# Patient Record
Sex: Male | Born: 1950 | Race: White | Hispanic: No | Marital: Married | Smoking: Never smoker
Health system: Southern US, Community
[De-identification: ages and names within clinical notes are randomized; demographics above are authoritative.]

## PROBLEM LIST (undated history)

## (undated) DIAGNOSIS — N189 Chronic kidney disease, unspecified: Secondary | ICD-10-CM

## (undated) DIAGNOSIS — G4730 Sleep apnea, unspecified: Secondary | ICD-10-CM

## (undated) DIAGNOSIS — N429 Disorder of prostate, unspecified: Secondary | ICD-10-CM

## (undated) DIAGNOSIS — K851 Biliary acute pancreatitis without necrosis or infection: Secondary | ICD-10-CM

## (undated) DIAGNOSIS — M1990 Unspecified osteoarthritis, unspecified site: Secondary | ICD-10-CM

## (undated) DIAGNOSIS — H9190 Unspecified hearing loss, unspecified ear: Secondary | ICD-10-CM

## (undated) DIAGNOSIS — I10 Essential (primary) hypertension: Secondary | ICD-10-CM

## (undated) DIAGNOSIS — I499 Cardiac arrhythmia, unspecified: Secondary | ICD-10-CM

## (undated) DIAGNOSIS — K219 Gastro-esophageal reflux disease without esophagitis: Secondary | ICD-10-CM

## (undated) DIAGNOSIS — E039 Hypothyroidism, unspecified: Secondary | ICD-10-CM

## (undated) HISTORY — PX: COLONOSCOPY: SHX174

## (undated) HISTORY — PX: KIDNEY STONE SURGERY: SHX686

## (undated) HISTORY — PX: INCISION AND DRAINAGE: SHX5863

## (undated) HISTORY — PX: ARM SKIN LESION BIOPSY / EXCISION: SUR471

## (undated) HISTORY — DX: Biliary acute pancreatitis without necrosis or infection: K85.10

---

## 2006-02-16 DIAGNOSIS — I10 Essential (primary) hypertension: Secondary | ICD-10-CM | POA: Insufficient documentation

## 2008-01-02 ENCOUNTER — Ambulatory Visit: Payer: Self-pay | Admitting: Gastroenterology

## 2008-01-02 LAB — HM COLONOSCOPY: HM Colonoscopy: NORMAL

## 2009-04-16 DIAGNOSIS — F988 Other specified behavioral and emotional disorders with onset usually occurring in childhood and adolescence: Secondary | ICD-10-CM | POA: Insufficient documentation

## 2010-01-21 DIAGNOSIS — Z8349 Family history of other endocrine, nutritional and metabolic diseases: Secondary | ICD-10-CM | POA: Insufficient documentation

## 2011-01-06 ENCOUNTER — Ambulatory Visit: Payer: Self-pay | Admitting: Urology

## 2011-09-20 ENCOUNTER — Ambulatory Visit: Payer: Self-pay | Admitting: Urology

## 2012-04-04 ENCOUNTER — Ambulatory Visit: Payer: Self-pay | Admitting: Family Medicine

## 2012-07-17 DIAGNOSIS — R972 Elevated prostate specific antigen [PSA]: Secondary | ICD-10-CM | POA: Insufficient documentation

## 2012-07-17 DIAGNOSIS — N138 Other obstructive and reflux uropathy: Secondary | ICD-10-CM | POA: Insufficient documentation

## 2012-07-17 DIAGNOSIS — N411 Chronic prostatitis: Secondary | ICD-10-CM | POA: Insufficient documentation

## 2012-07-17 DIAGNOSIS — R6882 Decreased libido: Secondary | ICD-10-CM | POA: Insufficient documentation

## 2012-07-17 DIAGNOSIS — N529 Male erectile dysfunction, unspecified: Secondary | ICD-10-CM | POA: Insufficient documentation

## 2012-07-17 DIAGNOSIS — M5430 Sciatica, unspecified side: Secondary | ICD-10-CM | POA: Insufficient documentation

## 2013-04-02 ENCOUNTER — Ambulatory Visit: Payer: Self-pay | Admitting: Urology

## 2014-04-27 ENCOUNTER — Ambulatory Visit: Payer: Self-pay | Admitting: Family Medicine

## 2014-04-28 DIAGNOSIS — N23 Unspecified renal colic: Secondary | ICD-10-CM | POA: Insufficient documentation

## 2014-04-29 ENCOUNTER — Ambulatory Visit: Payer: Self-pay | Admitting: Urology

## 2014-04-29 HISTORY — PX: OTHER SURGICAL HISTORY: SHX169

## 2014-04-29 LAB — CBC WITH DIFFERENTIAL/PLATELET
Basophil #: 0 10*3/uL (ref 0.0–0.1)
Basophil %: 0.2 %
Eosinophil #: 0.1 10*3/uL (ref 0.0–0.7)
Eosinophil %: 1.2 %
HCT: 43.2 % (ref 40.0–52.0)
HGB: 14.2 g/dL (ref 13.0–18.0)
LYMPHS ABS: 0.3 10*3/uL — AB (ref 1.0–3.6)
Lymphocyte %: 3.7 %
MCH: 30.2 pg (ref 26.0–34.0)
MCHC: 32.9 g/dL (ref 32.0–36.0)
MCV: 92 fL (ref 80–100)
MONOS PCT: 8.1 %
Monocyte #: 0.7 x10 3/mm (ref 0.2–1.0)
NEUTROS ABS: 7.3 10*3/uL — AB (ref 1.4–6.5)
NEUTROS PCT: 86.8 %
Platelet: 162 10*3/uL (ref 150–440)
RBC: 4.71 10*6/uL (ref 4.40–5.90)
RDW: 14.2 % (ref 11.5–14.5)
WBC: 8.4 10*3/uL (ref 3.8–10.6)

## 2014-04-29 LAB — COMPREHENSIVE METABOLIC PANEL
ALBUMIN: 3.4 g/dL (ref 3.4–5.0)
Alkaline Phosphatase: 142 U/L — ABNORMAL HIGH
Anion Gap: 7 (ref 7–16)
BUN: 19 mg/dL — AB (ref 7–18)
Bilirubin,Total: 2.1 mg/dL — ABNORMAL HIGH (ref 0.2–1.0)
CHLORIDE: 108 mmol/L — AB (ref 98–107)
CO2: 24 mmol/L (ref 21–32)
CREATININE: 1.91 mg/dL — AB (ref 0.60–1.30)
Calcium, Total: 8.8 mg/dL (ref 8.5–10.1)
EGFR (African American): 43 — ABNORMAL LOW
GFR CALC NON AF AMER: 37 — AB
Glucose: 113 mg/dL — ABNORMAL HIGH (ref 65–99)
Osmolality: 281 (ref 275–301)
Potassium: 4.2 mmol/L (ref 3.5–5.1)
SGOT(AST): 139 U/L — ABNORMAL HIGH (ref 15–37)
SGPT (ALT): 222 U/L — ABNORMAL HIGH
SODIUM: 139 mmol/L (ref 136–145)
Total Protein: 6.9 g/dL (ref 6.4–8.2)

## 2014-04-29 LAB — URINALYSIS, COMPLETE
Bacteria: NONE SEEN
Bilirubin,UR: NEGATIVE
GLUCOSE, UR: NEGATIVE mg/dL (ref 0–75)
Ketone: NEGATIVE
LEUKOCYTE ESTERASE: NEGATIVE
Nitrite: NEGATIVE
Ph: 6 (ref 4.5–8.0)
Protein: NEGATIVE
RBC,UR: 4 /HPF (ref 0–5)
SQUAMOUS EPITHELIAL: NONE SEEN
Specific Gravity: 1.008 (ref 1.003–1.030)
WBC UR: <1 /HPF (ref 0–5)

## 2014-05-01 LAB — URINE CULTURE

## 2014-05-04 LAB — CULTURE, BLOOD (SINGLE)

## 2014-05-05 ENCOUNTER — Ambulatory Visit: Payer: Self-pay | Admitting: Urology

## 2014-05-11 ENCOUNTER — Ambulatory Visit: Payer: Self-pay | Admitting: Urology

## 2014-05-16 ENCOUNTER — Inpatient Hospital Stay: Payer: Self-pay | Admitting: Internal Medicine

## 2014-05-16 LAB — COMPREHENSIVE METABOLIC PANEL
AST: 11 U/L — AB (ref 15–37)
Albumin: 3 g/dL — ABNORMAL LOW (ref 3.4–5.0)
Alkaline Phosphatase: 72 U/L
Anion Gap: 8 (ref 7–16)
BUN: 20 mg/dL — AB (ref 7–18)
Bilirubin,Total: 0.8 mg/dL (ref 0.2–1.0)
CHLORIDE: 105 mmol/L (ref 98–107)
CREATININE: 1.27 mg/dL (ref 0.60–1.30)
Calcium, Total: 8.6 mg/dL (ref 8.5–10.1)
Co2: 26 mmol/L (ref 21–32)
EGFR (African American): >60
Glucose: 130 mg/dL — ABNORMAL HIGH (ref 65–99)
Osmolality: 282 (ref 275–301)
POTASSIUM: 3.9 mmol/L (ref 3.5–5.1)
SGPT (ALT): 20 U/L
Sodium: 139 mmol/L (ref 136–145)
TOTAL PROTEIN: 6.6 g/dL (ref 6.4–8.2)

## 2014-05-16 LAB — URINALYSIS, COMPLETE
Bacteria: NONE SEEN
Bilirubin,UR: NEGATIVE
GLUCOSE, UR: NEGATIVE mg/dL (ref 0–75)
Ketone: NEGATIVE
Leukocyte Esterase: NEGATIVE
NITRITE: NEGATIVE
Ph: 6 (ref 4.5–8.0)
Protein: 100
RBC,UR: 116 /HPF (ref 0–5)
SPECIFIC GRAVITY: 1.018 (ref 1.003–1.030)
Squamous Epithelial: <1
WBC UR: 21 /HPF (ref 0–5)

## 2014-05-16 LAB — CBC
HCT: 40.4 % (ref 40.0–52.0)
HGB: 12.9 g/dL — AB (ref 13.0–18.0)
MCH: 29 pg (ref 26.0–34.0)
MCHC: 31.9 g/dL — ABNORMAL LOW (ref 32.0–36.0)
MCV: 91 fL (ref 80–100)
Platelet: 219 10*3/uL (ref 150–440)
RBC: 4.43 10*6/uL (ref 4.40–5.90)
RDW: 13.6 % (ref 11.5–14.5)
WBC: 19 10*3/uL — AB (ref 3.8–10.6)

## 2014-05-16 LAB — TROPONIN I: Troponin-I: <0.02 ng/mL

## 2014-05-17 LAB — CBC WITH DIFFERENTIAL/PLATELET
BASOS ABS: 0 10*3/uL (ref 0.0–0.1)
Basophil %: 0.4 %
EOS ABS: 0 10*3/uL (ref 0.0–0.7)
Eosinophil %: 0.3 %
HCT: 36.7 % — AB (ref 40.0–52.0)
HGB: 12.1 g/dL — ABNORMAL LOW (ref 13.0–18.0)
LYMPHS ABS: 0.7 10*3/uL — AB (ref 1.0–3.6)
LYMPHS PCT: 6.2 %
MCH: 29.8 pg (ref 26.0–34.0)
MCHC: 32.9 g/dL (ref 32.0–36.0)
MCV: 91 fL (ref 80–100)
MONO ABS: 1.1 x10 3/mm — AB (ref 0.2–1.0)
Monocyte %: 9.5 %
NEUTROS ABS: 9.9 10*3/uL — AB (ref 1.4–6.5)
NEUTROS PCT: 83.6 %
Platelet: 205 10*3/uL (ref 150–440)
RBC: 4.06 10*6/uL — AB (ref 4.40–5.90)
RDW: 14 % (ref 11.5–14.5)
WBC: 11.8 10*3/uL — ABNORMAL HIGH (ref 3.8–10.6)

## 2014-05-17 LAB — BASIC METABOLIC PANEL
Anion Gap: 6 — ABNORMAL LOW (ref 7–16)
BUN: 18 mg/dL (ref 7–18)
CALCIUM: 8.2 mg/dL — AB (ref 8.5–10.1)
CHLORIDE: 108 mmol/L — AB (ref 98–107)
CO2: 24 mmol/L (ref 21–32)
CREATININE: 1.25 mg/dL (ref 0.60–1.30)
EGFR (Non-African Amer.): >60
Glucose: 127 mg/dL — ABNORMAL HIGH (ref 65–99)
Osmolality: 279 (ref 275–301)
Potassium: 3.9 mmol/L (ref 3.5–5.1)
Sodium: 138 mmol/L (ref 136–145)

## 2014-05-18 LAB — BASIC METABOLIC PANEL
Anion Gap: 10 (ref 7–16)
BUN: 16 mg/dL (ref 7–18)
CALCIUM: 7.7 mg/dL — AB (ref 8.5–10.1)
CO2: 21 mmol/L (ref 21–32)
CREATININE: 1.03 mg/dL (ref 0.60–1.30)
Chloride: 112 mmol/L — ABNORMAL HIGH (ref 98–107)
EGFR (Non-African Amer.): >60
Glucose: 102 mg/dL — ABNORMAL HIGH (ref 65–99)
OSMOLALITY: 286 (ref 275–301)
Potassium: 4 mmol/L (ref 3.5–5.1)
Sodium: 143 mmol/L (ref 136–145)

## 2014-05-18 LAB — URINE CULTURE

## 2014-05-18 LAB — WBC: WBC: 5.8 10*3/uL (ref 3.8–10.6)

## 2014-05-21 LAB — CULTURE, BLOOD (SINGLE)

## 2014-06-16 ENCOUNTER — Ambulatory Visit: Payer: Self-pay | Admitting: Urology

## 2014-12-19 NOTE — H&P (Signed)
PATIENT NAME:  [name] [name], [name] [name] MR#:  [id number] DATE OF BIRTH:  Jan 13, 1951  DATE OF ADMISSION:  05/16/2014  REFERRING PHYSICIAN: Dr. Mindi Junker.   FAMILY PHYSICIAN: Dr. Sherrie Mustache.   REASON FOR ADMISSION: SIRS.   HISTORY OF PRESENT ILLNESS: The patient is a 64 year old male with a history of hypertension, nephrolithiasis. Has been followed closely by urology recently because of problems with nephrolithiasis and obstruction. Had a recent renal stent removed yesterday morning, developed fever last night, was started empirically on oral Levaquin. Presents to the Emergency Room today with worsening fever and tachycardia associated with nausea, vomiting, and abdominal pain. In the Emergency Room, the patient had a white count of 19,000 and was tachycardic. He is now admitted for further evaluation.   PAST MEDICAL HISTORY: 1.  Nephrolithiasis.  2.  Benign hypertension.  3.  BPH.  4.  GE reflux disease.   MEDICATIONS: 1.  Losartan 50 mg p.o. daily.  2.  Prevacid 15 mg p.o. daily.  3.  Flomax 0.4 mg p.o. daily.  4.  Cialis 5 mg p.o. daily.   ALLERGIES: IBUPROFEN AND PENICILLIN.   SOCIAL HISTORY: The patient denies alcohol or tobacco abuse.   FAMILY HISTORY: Positive for hypertension, but is otherwise unremarkable.   REVIEW OF SYSTEMS:  CONSTITUTIONAL: The patient has had fever but no change in weight.  EYES: No blurred or double vision. No glaucoma.  ENT: No tinnitus or hearing loss. No nasal discharge or bleeding. No difficulty swallowing.  RESPIRATORY: No cough or wheezing. Denies hemoptysis.  CARDIOVASCULAR: No chest pain or orthopnea. No palpitations or syncope.  GASTROINTESTINAL: No diarrhea or change in bowel habits.  GENITOURINARY: No incontinence.  ENDOCRINE: No polyuria or polydipsia. No heat or cold intolerance.  HEMATOLOGIC: The patient denies anemia, easy bruising or bleeding.  LYMPHATIC: No swollen glands.  MUSCULOSKELETAL: The patient has pain in his back, but denies neck,  shoulder, knee, or hip pain. No gout.  NEUROLOGIC: No numbness or migraines. Denies stroke or seizures.  PSYCHIATRIC: The patient denies anxiety, insomnia or depression.   PHYSICAL EXAMINATION: GENERAL: The patient is in no acute distress.  VITAL SIGNS: Currently remarkable for a blood pressure of 146/89, heart rate 125, respiratory rate 20, temperature of 98.6, sat 95% on room air.  HEENT: Normocephalic, atraumatic. Pupils equal, round, reactive to light and accommodation. Extraocular movements are intact. Sclerae are anicteric. Conjunctivae are clear.  OROPHARYNX: Clear.  NECK: Supple without JVD. No adenopathy or thyromegaly is noted.  LUNGS: Clear to auscultation and percussion without wheezes, rales or rhonchi. No dullness. Respiratory effort is normal.  CARDIAC: Exam reveals a rapid rate with a regular rhythm. Normal S1, S2. No significant rubs, murmurs or gallops. PMI is nondisplaced. Chest wall is nontender.  ABDOMEN: Soft but tender diffusely. No rebound or guarding. Normoactive bowel sounds. No organomegaly or masses were appreciated. Mild CVA tenderness was present.  EXTREMITIES: Without clubbing, cyanosis or edema. Pulses were 2+ bilaterally.  SKIN: Warm and dry without rash or lesions.  NEUROLOGIC: Cranial nerves II through XII grossly intact. Deep tendon reflexes were symmetric. Motor and sensory examination nonfocal.  PSYCHIATRIC: Revealed a patient who was alert and oriented to person, place, and time. He was cooperative and used good judgment.   LABORATORY DATA: Urinalysis was remarkable for proteinuria and 3+ blood with 21 WBCs per high-power field and clumps of WBCs present. White count was 19,000 with a hemoglobin of 12.9. Glucose 130 with a BUN of 20, creatinine 1.27 and a GFR of greater than 60.  ASSESSMENT: 1.  Systemic inflammatory response syndrome.  2.  Pyelonephritis.  3.  Nephrolithiasis.  4.  Dehydration.  5.  Tachycardia.  6.  Benign hypertension.   PLAN:  The patient will be admitted to the floor with telemetry. Will begin IV fluids and IV antibiotics. Blood and urine cultures have been sent. Will obtain a CT of the abdomen and pelvis. Will consult urology. Morphine for pain and Zofran for nausea or vomiting. Follow up routine labs in the morning. Further treatment and evaluation will depend upon the patient's progress.   TOTAL TIME SPENT ON THIS PATIENT: 50 minutes.     ____________________________ Duane LopeJeffrey D. Judithann SheenSparks, MD jds:at D: 05/16/2014 11:57:54 ET T: 05/16/2014 12:16:26 ET JOB#: [id number]  cc: Duane LopeJeffrey D. Judithann SheenSparks, MD, <Dictator> [name], MD Marguarite ArbourJEFFREY D Ella Guillotte MD ELECTRONICALLY SIGNED 05/16/2014 16:41

## 2014-12-19 NOTE — Consult Note (Signed)
Admit Reason:   Pyelonephritis (590.80): Status: Active, Coding System: ICD9, Coded Name: Pyelonephritis, unspecified    hypertension:   Home Medications: Medication Instructions Status  levofloxacin 750 mg oral tablet 1 tab(s) orally once a day x 5 days. Active  docusate sodium 100 mg oral capsule 2 caps (200mg) orally 2 times a day Active  losartan 50 mg oral tablet 1 tab(s) orally once a day (in the morning) Active  lansoprazole 15 mg oral delayed release capsule 1 cap(s) orally once a day (in the morning) Active  Flomax 0.4 mg oral capsule 1 cap(s) orally once a day Active  Cialis 5 mg oral tablet 1 tab(s) orally once a day Active   Lab Results: Hepatic:  19-Sep-15 10:17   Bilirubin, Total 0.8  Alkaline Phosphatase 72 (46-116 NOTE: New Reference Range 03/17/14)  SGPT (ALT) 20 (14-63 NOTE: New Reference Range 03/17/14)  SGOT (AST)  11  Total Protein, Serum 6.6  Albumin, Serum  3.0  Routine Chem:  19-Sep-15 10:17   Glucose, Serum  130  BUN  20  Creatinine (comp) 1.27  Sodium, Serum 139  Potassium, Serum 3.9  Chloride, Serum 105  CO2, Serum 26  Calcium (Total), Serum 8.6  Osmolality (calc) 282  eGFR (African American) >60  eGFR (Non-African American) >60 (eGFR values <60mL/min/1.73 m2 may be an indication of chronic kidney disease (CKD). Calculated eGFR is useful in patients with stable renal function. The eGFR calculation will not be reliable in acutely ill patients when serum creatinine is changing rapidly. It is not useful in  patients on dialysis. The eGFR calculation may not be applicable to patients at the low and high extremes of body sizes, pregnant women, and vegetarians.)  Anion Gap 8  Cardiac:  19-Sep-15 10:17   Troponin I < 0.02 (0.00-0.05 0.05 ng/mL or less: NEGATIVE  Repeat testing in 3-6 hrs  if clinically indicated. >0.05 ng/mL: POTENTIAL  MYOCARDIAL INJURY. Repeat  testing in 3-6 hrs if  clinically indicated. NOTE: An increase or  decrease  of 30% or more on serial  testing suggests a  clinically important change)  Routine Hem:  19-Sep-15 10:17   WBC (CBC)  19.0  RBC (CBC) 4.43  Hemoglobin (CBC)  12.9  Hematocrit (CBC) 40.4  Platelet Count (CBC) 219 (Result(s) reported on 16 May 2014 at 10:33AM.)  MCV 91  MCH 29.0  MCHC  31.9  RDW 13.6   Radiology Results:  Radiology Results: LabUnknown:    19-Sep-15 12:42, CT Abdomen Pelvis WO for Stone  PACS Image  CT:  CT Abdomen Pelvis WO for Stone  REASON FOR EXAM:    r flank pain  COMMENTS:       PROCEDURE: CT  - CT ABDOMEN /PELVIS WO (STONE)  - May 16 2014 12:42PM     CLINICAL DATA:  Fever. Renal calculi with stent placement and stone  removal 1 week ago. Stent removed yesterday. Right flank pain.    EXAM:  CT ABDOMEN AND PELVIS WITHOUT CONTRAST    TECHNIQUE:  Multidetector CT imaging of the abdomen and pelvis was performed  following the standard protocol without IV contrast.  COMPARISON:  Multiple exams, including 04/29/2014    FINDINGS:  Lower chest: Dependent atelectasis in both lower lobes, in the  lingula, and atelectasis in the right middle lobe. Mild  cardiomegaly.    Hepatobiliary: Layering tiny gallstones in the gallbladder.    Spleen: Intact    Pancreas: Intact    Stomach/Bowel: Small hiatal hernia. Scant colonic diverticula.    Appendix normal. Scattered air- fluid levels in nondilated small  bowel    Adrenals/urinary tract: 2 mm left kidney upper pole nonobstructive  calculus with adjacent 1 mm left kidney upper pole nonobstructive  calculus. 4 mm dependent calculus in the left-sided urinary bladder  is likely loose within the urinary bladder. No hydronephrosis or  hydroureter. I do not observe a ureteral calculus. There is  continued right periureteral stranding. Increased right perirenal  stranding compared to prior exam. Bilateral hypodense renal lesions  are similar to the prior exam. 6 mm hyperdense lesion of the left  kidney  lower pole, probably a complex cyst, unchanged.    Vascular/Lymphatic: Unremarkable    Reproductive: Prominent prostate gland, 6.5 x 4.9 cm.  Musculoskeletal: Small bilateral inguinal hernias contain adipose  tissue. Chronic sclerosis of the left pubic body with associated  spurring. 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    Other: None     IMPRESSION:  1. Increase perirenal stranding on the right without hydronephrosis  or new right-sided calculus. There is a 4 mm dependent calculus in  the urinary bladder. Stable left nephrolithiasis. Please note that  today' s exam was performed without a IV contrast and accordingly  sensitivity for pyelonephritis is significantly reduced.  2. Mild bibasilar atelectasis.  3. Cholelithiasis.  4. Small hiatal hernia.  5. Scattered air- fluid levels in nondilated small bowel which is  nonspecific and could be from enteritis or ileus.  6. Prominent prostate gland.  7. Ancillary findings include small bilateral inguinal hernias; L5  left unilateral pars defect; and chronic degenerative sclerosis in  the pubis.      Electronically Signed    By: Walt  Liebkemann M.D.    On: 05/16/2014 13:35         Verified By: WALTER D. LIEBKEMANN, M.D.,    Penicillin: Rash  Ibuprofen: Hives, Itching  Nursing Flowsheets: **Vital Signs.:   19-Sep-15 13:00  Vital Signs Type Admission  Temperature Temperature (F) 99.2  Celsius 37.3  Temperature Source oral  Pulse Pulse 109  Respirations Respirations 18  Systolic BP Systolic BP 146  Diastolic BP (mmHg) Diastolic BP (mmHg) 81  Mean BP 102  Pulse Ox % Pulse Ox % 95  Pulse Ox Activity Level  At rest  Oxygen Delivery Room Air/ 21 %    General Aspect 64-year-old male with a history of hypertension, nephrolithiasis, seen for urgent right ureter stent placement due to an obstructing stone on 04/29/14.  He then underwent URS with HLL for stone retrieval on 05/11/14 and his post-op  stent was removed by string on 05/15/14. He then developed fevers/chills and general malaise prompting his wife to bring him in.  In the ED he was tachycardic with a white count of 19, prompting admission.  CT showed left sided perinephric and perirenal stranding but no obstruction or retained right sided stone.   Case History and Physical Exam:  Chief Complaint Fever   Past Medical Health Hypertension   Past Surgical History as above   Family History Non-Contributory   HEENT PERLA   Neck/Nodes Supple   Chest/Lungs Clear   Breasts WNL   Cardiovascular No Murmurs or Gallops  Normal Sinus Rhythm   Abdomen Benign   Genitalia Not examined   Rectal Not examined   Musculoskeletal Full range of motion   Neurological Grossly WNL   Skin Warm  Dry    Impression 62M post ureteroscopic removal of an obstructing right ureter stone now with sepsis   from a urinary source after stent removal.   Plan 1) Culture urine and blood 2) Start broad spectrum antibiotics 3) No need for acute urologic intervention (i.e. ureteral stent) at this time as he appears adequately decompressed.  If fevers persist or his condition worsens, will consider ureteral stent placement 4) OK for regular diet   Electronic Signatures: Prentiss Bells (MD)  (Signed 19-Sep-15 14:47)  Authored: Health Issues, Significant Events - History, Home Medications, Labs, Radiology Results, Allergies, Vital Signs, General Aspect/Present Illness, History and Physical Exam, Impression/Plan   Last Updated: 19-Sep-15 14:47 by Prentiss Bells (MD)

## 2014-12-19 NOTE — Op Note (Signed)
PATIENT NAME:  [name] [name], [name] [name] MR#:  [id number] DATE OF BIRTH:  1950-10-22  DATE OF PROCEDURE:  04/29/2014   PREOPERATIVE DIAGNOSES:  1.  Right ureterolithiasis.  2.  Right hydronephrosis.   POSTOPERATIVE DIAGNOSES: 1.  Right ureterolithiasis.  2.  Right hydronephrosis.  3.  Pyonephrosis.  PROCEDURE:  1.  Cystoscopy with right double pigtail stent placement.  2.  Fluoroscopy.   SURGEON: Suszanne ConnersMichael R. Evelene CroonWolff, MD  ANESTHETIST: Naomie DeanWilliam K. Kephart, MD  ANESTHESIA: General.   INDICATIONS: See the dictated history and physical. After informed consent, the patient requests the above procedure.   OPERATIVE SUMMARY: After adequate general anesthesia had been obtained, the patient was placed into the dorsal lithotomy position and the perineum was prepped and draped in the usual fashion. Fluoroscopy revealed that he had a 5 x 6 mm stone in the right upper ureter. At this point, the 7221 French cystoscope was coupled to the camera and then visually advanced into the bladder. The bladder was thoroughly inspected. No bladder tumors were identified. Both ureteral orifices were identified and had clear efflux. At this point, a 0.035 Glidewire was advanced up the right ureter under fluoroscopic guidance. The guidewire was passed beyond the stone and curled into the renal pelvis. A 6 x 26 cm double pigtail stent was then advanced over the guidewire and positioned in the ureter. The guidewire was then removed, taking care to leave the stent in position. Purulent drainage was seen exiting the ureter after stent placement. At this point, the bladder was drained and the cystoscope was removed. Viscous Xylocaine, 10 mL, was instilled within the urethra and the bladder. B and O suppository was placed.   The procedure was then terminated, and the patient was transferred to the recovery room in stable condition.    ____________________________ Suszanne ConnersMichael R. Evelene CroonWolff, MD mrw:MT D: 04/29/2014 22:50:18 ET T: 04/30/2014  05:11:41 ET JOB#: [id number]  cc: Suszanne ConnersMichael R. Evelene CroonWolff, MD, <Dictator> Orson ApeMICHAEL R Sharika Mosquera MD ELECTRONICALLY SIGNED 04/30/2014 8:40

## 2014-12-19 NOTE — Consult Note (Signed)
Chief Complaint:  Subjective/Chief Complaint Febrile with chills overnight, but no pain, no nausea/vomiting. Tol POs, ambulating   VITAL SIGNS/ANCILLARY NOTES: *Intake and Output.:   Shift 20-Sep-15 15:00  Grand Totals Intake:  950 Output:  1500    Net:  -550 24 Hr.:  -550  Oral Intake      In:  240  IV (Primary)      In:  710  Urine ml     Out:  1500  Length of Stay Totals Intake:  2961 Output:  3600    Net:  -440   Brief Assessment:  GEN well developed, well nourished   Cardiac Regular  no murmur   Respiratory normal resp effort  clear BS   Gastrointestinal Normal   Gastrointestinal details normal Soft  Nontender   EXTR negative cyanosis/clubbing, negative edema   Additional Physical Exam No CVAT   Lab Results: Routine Chem:  20-Sep-15 04:45   Glucose, Serum  127  BUN 18  Creatinine (comp) 1.25  Sodium, Serum 138  Potassium, Serum 3.9  Chloride, Serum  108  CO2, Serum 24  Calcium (Total), Serum  8.2  Anion Gap  6  Osmolality (calc) 279  eGFR (African American) >60  eGFR (Non-African American) >60 (eGFR values <66m/min/1.73 m2 may be an indication of chronic kidney disease (CKD). Calculated eGFR is useful in patients with stable renal function. The eGFR calculation will not be reliable in acutely ill patients when serum creatinine is changing rapidly. It is not useful in  patients on dialysis. The eGFR calculation may not be applicable to patients at the low and high extremes of body sizes, pregnant women, and vegetarians.)  Routine Hem:  20-Sep-15 04:45   WBC (CBC)  11.8  RBC (CBC)  4.06  Hemoglobin (CBC)  12.1  Hematocrit (CBC)  36.7  Platelet Count (CBC) 205  MCV 91  MCH 29.8  MCHC 32.9  RDW 14.0  Neutrophil % 83.6  Lymphocyte % 6.2  Monocyte % 9.5  Eosinophil % 0.3  Basophil % 0.4  Neutrophil #  9.9  Lymphocyte #  0.7  Monocyte #  1.1  Eosinophil # 0.0  Basophil # 0.0 (Result(s) reported on 17 May 2014 at 05:29AM.)    Assessment/Plan:  Assessment/Plan:  Assessment 17M post ureteroscopic removal of an obstructing right ureter stone with sepsis from a urinary source after stent removal. Febrile overnight but leukocytosis significantly improved (19 --> 11.8)   Plan 1) Follow-up final urine and blood cultures 2) Continue broad spectrum antibiotics, tailor when culture data available 3) No need for acute urologic intervention (i.e. ureteral stent) at this time as he appears adequately decompressed and leukocytosis improving.   Electronic Signatures: KPrentiss Bells(MD)  (Signed 20-Sep-15 13:41)  Authored: Chief Complaint, VITAL SIGNS/ANCILLARY NOTES, Brief Assessment, Lab Results, Assessment/Plan   Last Updated: 20-Sep-15 13:41 by KPrentiss Bells(MD)

## 2014-12-19 NOTE — Consult Note (Signed)
PATIENT NAME:  [name] EatonCHY, [name]:  [id number] DATE OF BIRTH:  Jan 27, 1951  DATE OF CONSULTATION:  04/29/2014  REFERRING PHYSICIAN:  Dr. Mayford KnifeWilliams CONSULTING PHYSICIAN:  Suszanne ConnersMichael R. Evelene CroonWolff, MD  REASON FOR CONSULTATION: Kidney stone.   HISTORY OF PRESENT ILLNESS: Mr. [name] [name] is a 64 year old white male with a 3 day history of nausea, vomiting, fever, chills, right flank and abdominal pain. He went to the Emergency Room today because his symptoms were worse. CT scan indicated that he had 2 proximal stones measuring 5 mm and 2 mm with hydronephrosis.   ALLERGIES: THE PATIENT ALLERGIC TO PENICILLIN AND IBUPROFEN.   CURRENT MEDICATIONS: Cozaar, Cialis, Focalin, lansoprazole and aspirin.   PAST SURGICAL HISTORY: Ureteroscopic stone retrieval 2005.   SOCIAL HISTORY: The patient denied cigarette use. He consumes 7-10 alcoholic beverages per week.   FAMILY HISTORY: Negative for urological disease.   PAST AND CURRENT MEDICAL CONDITIONS:  1.  Hypertension.  2.  BPH. 3.  GERD. 4.  ADHD.  REVIEW OF SYSTEMS: The patient denied chest pain, shortness of breath, diabetes or stroke.   PHYSICAL EXAMINATION:  GENERAL: A well-nourished white male in some distress due to pain.  HEENT: Sclerae were clear. Pupils were equally round, reactive to light and accommodation.  NECK: Supple.  LUNGS: Clear to auscultation.  CARDIOVASCULAR: Regular rhythm and rate without audible murmurs.  ABDOMEN: Soft abdomen, mild right flank tenderness.  GENITOURINARY AND RECTAL: Deferred.  NEUROMUSCULAR: Alert and oriented x 3.   IMPRESSION:  1.  Right hydronephrosis due to 2 proximal stones.  2.  Urinary tract infection.   PLAN: Cystoscopy with right stent placement.   ____________________________ Suszanne ConnersMichael R. Evelene CroonWolff, MD mrw:TT D: 04/29/2014 21:06:34 ET T: 04/29/2014 21:32:35 ET JOB#: [id number]  cc: Suszanne ConnersMichael R. Evelene CroonWolff, MD, <Dictator> Orson ApeMICHAEL R WOLFF MD ELECTRONICALLY SIGNED 04/30/2014 8:40

## 2014-12-19 NOTE — Op Note (Signed)
PATIENT NAME:  [name], [name] MR#:  [name] DATE OF BIRTH:  1951-08-01  DATE OF PROCEDURE:  05/11/2014  PREOPERATIVE DIAGNOSIS: Right ureteral stone.  POSTOPERATIVE DIAGNOSIS: Right ureteral stone.  PROCEDURE PERFORMED: Right ureteroscopy, laser lithotripsy, right ureteral stent exchange, interpretation of fluoroscopy less than 30 minutes.  SURGEON: Claris Gladden, MD  ANESTHESIA: General anesthesia.   INTRAVENOUS FLUIDS: See anesthesia record.  ESTIMATED BLOOD LOSS: Minimal.  DRAINS: A 6 x 26 French double-J ureteral stent.  SPECIMENS: Stone fragment.  COMPLICATIONS: None.  INDICATIONS: This is a 64 year old male with a history of a 5 and 2 mm ureteral stone on the right, who presented several weeks ago to the ED with a fever to 102. He underwent right ureteral stent placement and returns today for definitive management of his stones. Preoperative urine culture was negative. Risks and benefits of the procedure were explained in detail and the patient agreed to proceed.  DESCRIPTION OF PROCEDURE: The patient was correctly identified in the preoperative holding area and informed consent was obtained. He was brought to the operating room suite and was placed on the table in the supine position. At this time, a universal timeout protocol was performed. All team members were identified and Venodyne boots were placed. He was administered IV Levaquin in the perioperative period. He has been placed under general anesthesia with a mask LMA airway and repositioned lower in the bed in the dorsal lithotomy position. He was then prepped and draped in the standard surgical fashion.   At this point in time, a rigid cystoscope, using a 22 [place] Access sheath, was advanced per urethra into the bladder, at which time the right ureteral orifice was identified. A stent was seen emanating from this orifice, which was grasped using stent graspers and brought out to the level of the urethral meatus. This  was then cannulated using a 0.035 Sensor wire up to the level of the renal pelvis, confirmed under fluoroscopic guidance. The stent was then removed, leaving only the wire placed. The wire was snapped in place as a safety wire. A semi-rigid ureteroscope was then used and advanced up to the level of the stone, somewhat impacted appearing, about 2 cm from the distal ureter. This was fractioned into multiple fragments using a 365 micron laser fiber using the settings of 0.8 joules and 10 Hz. A Tipless Nitinol basket was then used to extract each of these stone fragments. The semi-rigid was then advanced up to the mid ureter, and the 5 mm stone was encountered. This was also fractioned using the laser fiber, and the remaining pieces were removed via basket. These were sent off for stone analysis.   At this point in time, to ensure there was no migration of stone fragment to the proximal ureter, a second Sensor wire was advanced up to the level of the renal pelvis under fluoroscopic guidance. A flexible ureteroscope was then advanced over the wire, up to the level of the renal pelvis and the wire was removed. The renal pelvis was surveyed, and there was no evidence of stone fragment whatsoever. The scope was then backed down the entire length of the ureter, which was carefully inspected, and there were no residual stone fragments. There was minimal ureteral edema. The safety wire was then backloaded over a rigid cystoscope and a 6 x 26 French Double-J ureteral stent was advanced over the wire, up to the level of the renal pelvis. The wire was partially withdrawn and closed, noted within the renal pelvis.  The wire was fully withdrawn and a coil was noted within the bladder. The bladder was then drained. The string was left on the stent, which was fixed to the glans penis using Mastisol on a Steri-Strips.   The patient was cleaned and dried and was reversed from anesthesia after being repositioned in the supine  position and taken to the PACU in stable condition.   There were no complications to this case.   ____________________________ Claris GladdenAshley J. Jeriyah Granlund, MD ajb:MT D: 05/11/2014 09:02:49 ET T: 05/11/2014 09:36:44 ET JOB#: [id number]  cc: Claris GladdenAshley J. Larissa Pegg, MD, <Dictator> Claris GladdenASHLEY J Roniyah Llorens MD ELECTRONICALLY SIGNED 06/07/2014 8:30

## 2014-12-19 NOTE — Discharge Summary (Signed)
PATIENT NAME:  [name] [name], [name] [name] MR#:  [id number] DATE OF BIRTH:  [birth year]  DATE OF ADMISSION:  05/16/2014 DATE OF DISCHARGE:  05/18/2014  PRIMARY CARE PHYSICIAN:  Dr. Sherrie MustacheFisher.    FINAL DIAGNOSES:  1.  Clinical sepsis, pyelonephritis.  2.  Nephrolithiasis.  3.  Initial hypotension and hypertension.   MEDICATIONS ON DISCHARGE: Include Cialis 5 mg daily, Colace 100 mg 2 capsules twice a day, losartan 50 mg daily, lansoprazole 15 mg daily, Flomax 0.4 mg daily, cephalexin 500 mg every 8 hours for 8 more days, stop taking Levaquin.   DIET: Low sodium diet, regular consistency, stay hydrated.   ACTIVITY: As tolerated.   FOLLOWUP:  With Dr. Vanna ScotlandAshley Brandon, urology as scheduled, in 1-2 weeks with Dr. Sherrie MustacheFisher.   HOSPITAL COURSE:  1.  The patient was admitted 05/16/2014 and discharged 05/18/2014, was brought in with systemic inflammatory response syndrome with elevated white blood cell count, fever, tachycardia, at home he did also have hypotension. This was believed secondary to pyelonephritis even though urine culture and blood cultures were negative. The patient was given IV Levaquin and Rocephin here, switched over to Keflex upon discharge.  2.  For the patient's nephrolithiasis recently had stent removal by Dr. Apolinar JunesBrandon in the office, seen in consultation here by Dr. Arlyce DiceKaplan, no intervention needed at this point.   3.  For the patient's initial hypotension, then hypertension, the patient is on losartan.   LABORATORY AND RADIOLOGICAL DATA DURING THE HOSPITAL COURSE: Included blood cultures that were negative. Troponin negative. White blood cell count 19.0, hemoglobin and hematocrit 12.9 and 40.0, platelet count of 219,000. Glucose 130, BUN 20, creatinine 1.27, sodium 139, potassium 3.9, chloride 105, CO2 26, calcium 8.6. Liver function tests normal range. Urinalysis only grew out 6,000 gram-positive cocci. Urine analysis 3 + blood. CT scan of the abdomen and pelvis without contrast for stone showed  increased perineal stranding on the right without hydronephrosis, a 4 mm depending calculus in the urinary bladder, stable left nephrolithiasis, the patient does have small bilateral inguinal hernias. Creatinine upon discharge 1.03. White blood cell count 5.8.    PLAN:   1.  With the patient's clinical sepsis and pyelonephritis the patient was afebrile, white count normalized and the patient had no pain, much improved and discharged home on Keflex.  2.  With respect to the nephrolithiasis follow up with Dr. Vanna ScotlandAshley Brandon as outpatient. The patient is on Flomax after the stent removal, must stay hydrated.  3.  Initial hypotension and hypertension. The patient's blood pressure upon discharge actually a little high at 154/93, can restart the losartan.    TIME SPENT ON DISCHARGE: 35 minutes.    ____________________________ [name] Dimesichard J. Renae GlossWieting, MD rjw:bu D: 05/18/2014 15:59:46 ET T: 05/18/2014 20:26:02 ET JOB#: [id number]  cc: [name] Dimesichard J. Renae GlossWieting, MD, <Dictator> [name], MD Salley ScarletICHARD J Cordia Miklos MD ELECTRONICALLY SIGNED 05/21/2014 14:02

## 2015-01-05 LAB — BASIC METABOLIC PANEL
BUN: 22 mg/dL — AB (ref 4–21)
CREATININE: 1.2 mg/dL (ref 0.6–1.3)
Glucose: 105 mg/dL
SODIUM: 141 mmol/L (ref 137–147)

## 2015-01-05 LAB — LIPID PANEL
CHOLESTEROL: 206 mg/dL — AB (ref 0–200)
HDL: 43 mg/dL (ref 35–70)
LDL Cholesterol: 139 mg/dL
TRIGLYCERIDES: 118 mg/dL (ref 40–160)

## 2015-01-05 LAB — HEPATIC FUNCTION PANEL
ALT: 13 U/L (ref 10–40)
AST: 13 U/L — AB (ref 14–40)

## 2015-01-05 LAB — TSH: TSH: 5.1 u[IU]/mL (ref 0.41–5.90)

## 2015-01-08 ENCOUNTER — Ambulatory Visit: Payer: BLUE CROSS/BLUE SHIELD | Attending: Family Medicine

## 2015-01-08 DIAGNOSIS — G4733 Obstructive sleep apnea (adult) (pediatric): Secondary | ICD-10-CM | POA: Insufficient documentation

## 2015-01-08 DIAGNOSIS — R0683 Snoring: Secondary | ICD-10-CM | POA: Insufficient documentation

## 2015-02-09 ENCOUNTER — Ambulatory Visit: Payer: BLUE CROSS/BLUE SHIELD | Attending: Otolaryngology

## 2015-02-09 DIAGNOSIS — G4733 Obstructive sleep apnea (adult) (pediatric): Secondary | ICD-10-CM | POA: Diagnosis not present

## 2015-02-09 DIAGNOSIS — R0683 Snoring: Secondary | ICD-10-CM | POA: Insufficient documentation

## 2015-02-09 DIAGNOSIS — G4761 Periodic limb movement disorder: Secondary | ICD-10-CM | POA: Insufficient documentation

## 2015-02-17 ENCOUNTER — Encounter: Payer: Self-pay | Admitting: *Deleted

## 2015-02-17 ENCOUNTER — Encounter
Admission: RE | Admit: 2015-02-17 | Discharge: 2015-02-17 | Disposition: A | Discharge status: Home / Self Care | Payer: BLUE CROSS/BLUE SHIELD | Source: Ambulatory Visit | Attending: Ophthalmology | Admitting: Ophthalmology

## 2015-02-17 DIAGNOSIS — Z87442 Personal history of urinary calculi: Secondary | ICD-10-CM | POA: Insufficient documentation

## 2015-02-17 DIAGNOSIS — Z880 Allergy status to penicillin: Secondary | ICD-10-CM | POA: Diagnosis not present

## 2015-02-17 DIAGNOSIS — Z01810 Encounter for preprocedural cardiovascular examination: Secondary | ICD-10-CM | POA: Insufficient documentation

## 2015-02-17 DIAGNOSIS — Z79899 Other long term (current) drug therapy: Secondary | ICD-10-CM | POA: Diagnosis not present

## 2015-02-17 DIAGNOSIS — Z01812 Encounter for preprocedural laboratory examination: Secondary | ICD-10-CM | POA: Diagnosis present

## 2015-02-17 DIAGNOSIS — G4730 Sleep apnea, unspecified: Secondary | ICD-10-CM | POA: Diagnosis not present

## 2015-02-17 DIAGNOSIS — N429 Disorder of prostate, unspecified: Secondary | ICD-10-CM | POA: Diagnosis not present

## 2015-02-17 DIAGNOSIS — K219 Gastro-esophageal reflux disease without esophagitis: Secondary | ICD-10-CM | POA: Insufficient documentation

## 2015-02-17 DIAGNOSIS — H2511 Age-related nuclear cataract, right eye: Secondary | ICD-10-CM | POA: Diagnosis not present

## 2015-02-17 DIAGNOSIS — E039 Hypothyroidism, unspecified: Secondary | ICD-10-CM | POA: Insufficient documentation

## 2015-02-17 DIAGNOSIS — E079 Disorder of thyroid, unspecified: Secondary | ICD-10-CM | POA: Diagnosis not present

## 2015-02-17 DIAGNOSIS — I499 Cardiac arrhythmia, unspecified: Secondary | ICD-10-CM | POA: Diagnosis not present

## 2015-02-17 DIAGNOSIS — Z886 Allergy status to analgesic agent status: Secondary | ICD-10-CM | POA: Diagnosis not present

## 2015-02-17 DIAGNOSIS — M1990 Unspecified osteoarthritis, unspecified site: Secondary | ICD-10-CM | POA: Diagnosis not present

## 2015-02-17 DIAGNOSIS — I10 Essential (primary) hypertension: Secondary | ICD-10-CM | POA: Insufficient documentation

## 2015-02-17 DIAGNOSIS — H9190 Unspecified hearing loss, unspecified ear: Secondary | ICD-10-CM | POA: Diagnosis not present

## 2015-02-17 LAB — POTASSIUM: Potassium: 4 mmol/L (ref 3.5–5.1)

## 2015-02-23 ENCOUNTER — Ambulatory Visit: Payer: BLUE CROSS/BLUE SHIELD | Admitting: Anesthesiology

## 2015-02-23 ENCOUNTER — Encounter: Payer: Self-pay | Admitting: *Deleted

## 2015-02-23 ENCOUNTER — Ambulatory Visit
Admission: RE | Admit: 2015-02-23 | Discharge: 2015-02-23 | Disposition: A | Discharge status: Home / Self Care | Payer: BLUE CROSS/BLUE SHIELD | Source: Ambulatory Visit | Attending: Ophthalmology | Admitting: Ophthalmology

## 2015-02-23 ENCOUNTER — Encounter
Admission: RE | Disposition: A | Discharge status: Home / Self Care | Payer: Self-pay | Source: Ambulatory Visit | Attending: Ophthalmology

## 2015-02-23 DIAGNOSIS — Z886 Allergy status to analgesic agent status: Secondary | ICD-10-CM | POA: Insufficient documentation

## 2015-02-23 DIAGNOSIS — Z79899 Other long term (current) drug therapy: Secondary | ICD-10-CM | POA: Insufficient documentation

## 2015-02-23 DIAGNOSIS — I10 Essential (primary) hypertension: Secondary | ICD-10-CM | POA: Insufficient documentation

## 2015-02-23 DIAGNOSIS — H2511 Age-related nuclear cataract, right eye: Secondary | ICD-10-CM | POA: Diagnosis not present

## 2015-02-23 DIAGNOSIS — G4730 Sleep apnea, unspecified: Secondary | ICD-10-CM | POA: Insufficient documentation

## 2015-02-23 DIAGNOSIS — Z880 Allergy status to penicillin: Secondary | ICD-10-CM | POA: Insufficient documentation

## 2015-02-23 DIAGNOSIS — E079 Disorder of thyroid, unspecified: Secondary | ICD-10-CM | POA: Insufficient documentation

## 2015-02-23 DIAGNOSIS — K219 Gastro-esophageal reflux disease without esophagitis: Secondary | ICD-10-CM | POA: Insufficient documentation

## 2015-02-23 DIAGNOSIS — H9190 Unspecified hearing loss, unspecified ear: Secondary | ICD-10-CM | POA: Insufficient documentation

## 2015-02-23 DIAGNOSIS — Z87442 Personal history of urinary calculi: Secondary | ICD-10-CM | POA: Insufficient documentation

## 2015-02-23 DIAGNOSIS — M1990 Unspecified osteoarthritis, unspecified site: Secondary | ICD-10-CM | POA: Insufficient documentation

## 2015-02-23 HISTORY — PX: CATARACT EXTRACTION W/PHACO: SHX586

## 2015-02-23 HISTORY — DX: Chronic kidney disease, unspecified: N18.9

## 2015-02-23 HISTORY — DX: Unspecified hearing loss, unspecified ear: H91.90

## 2015-02-23 HISTORY — DX: Hypothyroidism, unspecified: E03.9

## 2015-02-23 HISTORY — DX: Gastro-esophageal reflux disease without esophagitis: K21.9

## 2015-02-23 HISTORY — DX: Cardiac arrhythmia, unspecified: I49.9

## 2015-02-23 HISTORY — DX: Essential (primary) hypertension: I10

## 2015-02-23 HISTORY — DX: Sleep apnea, unspecified: G47.30

## 2015-02-23 HISTORY — DX: Unspecified osteoarthritis, unspecified site: M19.90

## 2015-02-23 HISTORY — DX: Disorder of prostate, unspecified: N42.9

## 2015-02-23 SURGERY — PHACOEMULSIFICATION, CATARACT, WITH IOL INSERTION
Anesthesia: Monitor Anesthesia Care | Site: Eye | Laterality: Right | Wound class: Clean

## 2015-02-23 MED ORDER — MIDAZOLAM HCL 2 MG/2ML IJ SOLN
INTRAMUSCULAR | Status: DC | PRN
  Administered 2015-02-23: 2 mg via INTRAVENOUS
  Administered 2015-02-23 (×2): 1 mg via INTRAVENOUS

## 2015-02-23 MED ORDER — CEFUROXIME OPHTHALMIC INJECTION 1 MG/0.1 ML
INJECTION | OPHTHALMIC | Status: AC
  Filled 2015-02-23: qty 0.1

## 2015-02-23 MED ORDER — POVIDONE-IODINE 5 % OP SOLN
OPHTHALMIC | Status: AC
  Administered 2015-02-23: 1 via OPHTHALMIC
  Filled 2015-02-23: qty 30

## 2015-02-23 MED ORDER — EPINEPHRINE HCL 1 MG/ML IJ SOLN
INTRAOCULAR | Status: DC | PRN
  Administered 2015-02-23: 200 mL

## 2015-02-23 MED ORDER — NA CHONDROIT SULF-NA HYALURON 40-17 MG/ML IO SOLN
INTRAOCULAR | Status: AC
  Filled 2015-02-23: qty 1

## 2015-02-23 MED ORDER — MOXIFLOXACIN HCL 0.5 % OP SOLN
OPHTHALMIC | Status: DC | PRN
  Administered 2015-02-23: 1 [drp]

## 2015-02-23 MED ORDER — SODIUM CHLORIDE 0.9 % IV SOLN
INTRAVENOUS | Status: DC
  Administered 2015-02-23: 12:00:00 via INTRAVENOUS

## 2015-02-23 MED ORDER — POVIDONE-IODINE 5 % OP SOLN
1.0000 | OPHTHALMIC | Status: DC | PRN
Start: 2015-02-23 — End: 2015-02-23

## 2015-02-23 MED ORDER — ARMC OPHTHALMIC DILATING GEL: 1.0000 "application " | OPHTHALMIC | Status: DC | PRN

## 2015-02-23 MED ORDER — MOXIFLOXACIN HCL 0.5 % OP SOLN: 1.0000 [drp] | Freq: Once | OPHTHALMIC | Status: DC

## 2015-02-23 MED ORDER — TETRACAINE HCL 0.5 % OP SOLN: 1.0000 [drp] | OPHTHALMIC | Status: DC | PRN

## 2015-02-23 MED ORDER — TETRACAINE HCL 0.5 % OP SOLN
OPHTHALMIC | Status: AC
  Administered 2015-02-23: 1 [drp] via OPHTHALMIC
  Filled 2015-02-23: qty 2

## 2015-02-23 MED ORDER — ARMC OPHTHALMIC DILATING GEL
OPHTHALMIC | Status: AC
  Administered 2015-02-23: 0.5 via OPHTHALMIC
  Filled 2015-02-23: qty 0.25

## 2015-02-23 MED ORDER — EPINEPHRINE HCL 1 MG/ML IJ SOLN
INTRAMUSCULAR | Status: AC
  Filled 2015-02-23: qty 1

## 2015-02-23 MED ORDER — MOXIFLOXACIN HCL 0.5 % OP SOLN
OPHTHALMIC | Status: AC
  Filled 2015-02-23: qty 3

## 2015-02-23 MED ORDER — CARBACHOL 0.01 % IO SOLN
INTRAOCULAR | Status: DC | PRN
  Administered 2015-02-23: 0.5 mL via INTRAOCULAR

## 2015-02-23 SURGICAL SUPPLY — 22 items
CANNULA ANT/CHMB 27GA (MISCELLANEOUS) ×2 IMPLANT
GLOVE BIO SURGEON STRL SZ8 (GLOVE) ×2 IMPLANT
GLOVE BIOGEL M 6.5 STRL (GLOVE) ×2 IMPLANT
GLOVE SURG LX 8.0 MICRO (GLOVE) ×1
GLOVE SURG LX STRL 8.0 MICRO (GLOVE) ×1 IMPLANT
GOWN STRL REUS W/ TWL LRG LVL3 (GOWN DISPOSABLE) ×2 IMPLANT
GOWN STRL REUS W/TWL LRG LVL3 (GOWN DISPOSABLE) ×2
LENS IOL TORIC 24.5 (Intraocular Lens) ×4 IMPLANT
PACK CATARACT (MISCELLANEOUS) ×2 IMPLANT
PACK CATARACT BRASINGTON LX (MISCELLANEOUS) ×2 IMPLANT
PACK EYE AFTER SURG (MISCELLANEOUS) ×2 IMPLANT
PACK VITRECTOMY CASSETTE 23GA (MISCELLANEOUS) IMPLANT
PACK VITRECTOMY CASSETTE 25GA (MISCELLANEOUS) IMPLANT
SOL BSS BAG (MISCELLANEOUS) ×2
SOL PREP PVP 2OZ (MISCELLANEOUS) ×2
SOLUTION BSS BAG (MISCELLANEOUS) ×1 IMPLANT
SOLUTION PREP PVP 2OZ (MISCELLANEOUS) ×1 IMPLANT
SYR 5ML LL (SYRINGE) ×2 IMPLANT
SYR TB 1ML 27GX1/2 LL (SYRINGE) ×2 IMPLANT
WATER STERILE IRR 1000ML POUR (IV SOLUTION) ×2 IMPLANT
WIPE NON LINTING 3.25X3.25 (MISCELLANEOUS) ×2 IMPLANT
tecnis toric lens zct300 24.5 ×2 IMPLANT

## 2015-02-23 NOTE — Anesthesia Postprocedure Evaluation (Signed)
  Anesthesia Post-op Note  Patient: Nadara EatonDonald Gang  Procedure(s) Performed: Procedure(s) with comments: CATARACT EXTRACTION PHACO AND INTRAOCULAR LENS PLACEMENT (IOC) (Right) - [place] 01:01 AP%23.5 CDE 14.49 fluid pack lot #[id number]  Anesthesia type:MAC  Patient location: PACU  Post pain: Pain level controlled  Post assessment: Post-op Vital signs reviewed, Patient's Cardiovascular Status Stable, Respiratory Function Stable, Patent Airway and No signs of Nausea or vomiting  Post vital signs: Reviewed and stable  Last Vitals:  Filed Vitals:   02/23/15 1157  BP: 155/100  Pulse: 92  Temp: 36.1 C  Resp:     Level of consciousness: awake, alert  and patient cooperative  Complications: No apparent anesthesia complications

## 2015-02-23 NOTE — H&P (Signed)
All labs reviewed. Abnormal studies sent to patients PCP when indicated.  Previous H&P reviewed, patient examined, there are NO CHANGES.  [name] [name] LOUIS6/28/201611:25 AM

## 2015-02-23 NOTE — Transfer of Care (Signed)
Immediate Anesthesia Transfer of Care Note  Patient: [name] [name]  Procedure(s) Performed: Procedure(s) with comments: CATARACT EXTRACTION PHACO AND INTRAOCULAR LENS PLACEMENT (IOC) (Right) - US 01:01 AP%23.5 CDE 14.49 fluid pack lot #[id number]  Patient Location: PACU  Anesthesia Type:MAC  Level of Consciousness: awake, alert  and oriented  Airway & Oxygen Therapy: Patient Spontanous Breathing  Post-op Assessment: Report given to RN and Post -op Vital signs reviewed and stable  Post vital signs: stable  Last Vitals:  Filed Vitals:   02/23/15 1157  BP: 155/100  Pulse: 92  Temp: 36.1 C  Resp:     Complications: No apparent anesthesia complications

## 2015-02-23 NOTE — Discharge Instructions (Addendum)
See cataract post op handout  Eye Surgery Discharge Instructions  Expect mild scratchy sensation or mild soreness. DO NOT RUB YOUR EYE!  The day of surgery:  Minimal physical activity, but bed rest is not required  No reading, computer work, or close hand work  No bending, lifting, or straining.  May watch TV  For 24 hours:  No driving, legal decisions, or alcoholic beverages  Safety precautions  Eat anything you prefer: It is better to start with liquids, then soup then solid foods.  _____ Eye patch should be worn until postoperative exam tomorrow.  ____ Solar shield eyeglasses should be worn for comfort in the sunlight/patch while sleeping  Resume all regular medications including aspirin or Coumadin if these were discontinued prior to surgery. You may shower, bathe, shave, or wash your hair. Tylenol may be taken for mild discomfort.  Call your doctor if you experience significant pain, nausea, or vomiting, fever > 101 or other signs of infection. [phone] or [phone] Specific instructions:  Follow-up Information    Follow up with Carlena BjornstadPORFILIO,WILLIAM LOUIS, MD On 02/24/2015.   Specialty:  Ophthalmology   Why:  9:40   Contact information:   [address] [place] [address] [phone]      Eye Surgery Discharge Instructions  Expect mild scratchy sensation or mild soreness. DO NOT RUB YOUR EYE!  The day of surgery:  Minimal physical activity, but bed rest is not required  No reading, computer work, or close hand work  No bending, lifting, or straining.  May watch TV  For 24 hours:  No driving, legal decisions, or alcoholic beverages  Safety precautions  Eat anything you prefer: It is better to start with liquids, then soup then solid foods.  _____ Eye patch should be worn until postoperative exam tomorrow.  ____ Solar shield eyeglasses should be worn for comfort in the sunlight/patch while sleeping  Resume all regular medications  including aspirin or Coumadin if these were discontinued prior to surgery. You may shower, bathe, shave, or wash your hair. Tylenol may be taken for mild discomfort.  Call your doctor if you experience significant pain, nausea, or vomiting, fever > 101 or other signs of infection. [phone] or [phone] Specific instructions:  Follow-up Information    Follow up with Carlena BjornstadPORFILIO,WILLIAM LOUIS, MD On 02/24/2015.   Specialty:  Ophthalmology   Why:  9:40   Contact information:   [address] [place] [address] [phone]

## 2015-02-23 NOTE — Anesthesia Preprocedure Evaluation (Addendum)
Anesthesia Evaluation  Patient identified by MRN, date of birth, ID band Patient awake    Reviewed: Allergy & Precautions, H&P , NPO status , Patient's Chart, lab work & pertinent test results, reviewed documented beta blocker date and time   Airway Mallampati: II  TM Distance: >3 FB Neck ROM: full    Dental no notable dental hx. (+) Teeth Intact   Pulmonary neg pulmonary ROS, sleep apnea ,  breath sounds clear to auscultation  Pulmonary exam normal       Cardiovascular Exercise Tolerance: Good hypertension, negative cardio ROS  Rhythm:regular Rate:Normal     Neuro/Psych negative neurological ROS  negative psych ROS   GI/Hepatic negative GI ROS, Neg liver ROS, GERD-  ,  Endo/Other  negative endocrine ROSdiabetesHypothyroidism   Renal/GU Renal disease     Musculoskeletal   Abdominal   Peds  Hematology negative hematology ROS (+)   Anesthesia Other Findings   Reproductive/Obstetrics negative OB ROS                             Anesthesia Physical Anesthesia Plan  ASA: III  Anesthesia Plan: MAC   Post-op Pain Management:    Induction:   Airway Management Planned:   Additional Equipment:   Intra-op Plan:   Post-operative Plan:   Informed Consent: I have reviewed the patients History and Physical, chart, labs and discussed the procedure including the risks, benefits and alternatives for the proposed anesthesia with the patient or authorized representative who has indicated his/her understanding and acceptance.     Plan Discussed with:   Anesthesia Plan Comments:         Anesthesia Quick Evaluation

## 2015-02-23 NOTE — Op Note (Signed)
PREOPERATIVE DIAGNOSIS:  Nuclear sclerotic cataract of the right eye.   POSTOPERATIVE DIAGNOSIS:  Nuclear sclerotic cataract of the right eye.   OPERATIVE PROCEDURE: Procedure(s): CATARACT EXTRACTION PHACO AND INTRAOCULAR LENS PLACEMENT (IOC)   SURGEON:  Galen ManilaWilliam Milayah Krell, MD.   ANESTHESIA: 1.      Managed anesthesia care. 2.      Topical tetracaine drops followed by 2% Xylocaine jelly applied in the preoperative holding area.  Anesthesiologist: Yevette EdwardsJames G Adams, MD CRNA: Irving BurtonJennifer Bachich, CRNA  COMPLICATIONS:  None.   TECHNIQUE:   Stop and chop    DESCRIPTION OF PROCEDURE:  The patient was examined and consented in the preoperative holding area where the aforementioned topical anesthesia was applied to the right eye.  The patient was brought back to the Operating Room where he was sat upright on the gurney and given a target to fixate upon while the eye was marked at the 3:00 and 9:00 position.  The patient was then reclined on the operating table.  The eye was prepped and draped in the usual sterile ophthalmic fashion and a lid speculum was placed. A paracentesis was created with the side port blade and the anterior chamber was filled with viscoelastic. A near clear corneal incision was performed with the steel keratome. A continuous curvilinear capsulorrhexis was performed with a cystotome followed by the capsulorrhexis forceps. Hydrodissection and hydrodelineation were carried out with BSS on a blunt cannula. The lens was removed in a stop and chop technique and the remaining cortical material was removed with the irrigation-aspiration handpiece. The eye was inflated with viscoelastic and the ZCT  lens  was placed in the eye and rotated to within a few degrees of the predetermined orientation.  The remaining viscoelastic was removed from the eye.  The Sinskey hook was used to rotate the toric lens into its final resting place at 077 degrees.  0.2 mL of Vigamox diluted three to one with BSS was  placed in the anterior chamber. The eye was inflated to a physiologic pressure and found to be watertight.  The eye was dressed with Vigamox. The patient was given protective glasses to wear throughout the day and a shield with which to sleep tonight. The patient was also given drops with which to begin a drop regimen today and will follow-up with me in one day.  Implant Name Type Inv. Item Serial No. Manufacturer Lot No. LRB No. Used  tecnis toric lens [id number] 24.5     [id number]     Right 1   Procedure(s) with comments: CATARACT EXTRACTION PHACO AND INTRAOCULAR LENS PLACEMENT (IOC) (Right) - US 01:01 AP%23.5 CDE 14.49 fluid pack lot #[id number]  Electronically signed: Doniel Maiello LOUIS 02/23/2015 11:55 AM

## 2015-02-25 ENCOUNTER — Encounter: Payer: Self-pay | Admitting: Ophthalmology

## 2015-03-02 ENCOUNTER — Telehealth: Payer: Self-pay | Admitting: Family Medicine

## 2015-03-02 ENCOUNTER — Encounter: Payer: Self-pay | Admitting: Ophthalmology

## 2015-03-02 DIAGNOSIS — E039 Hypothyroidism, unspecified: Secondary | ICD-10-CM

## 2015-03-02 NOTE — Telephone Encounter (Signed)
Looks like pt was started on Levothyroxine on 01/07/2015 and was told to follow up in 2-3 months for labs and office visit.

## 2015-03-02 NOTE — Telephone Encounter (Signed)
Pt wife called to request a lab slip recheck labs.  CB#[phone]/MJ

## 2015-03-03 DIAGNOSIS — E039 Hypothyroidism, unspecified: Secondary | ICD-10-CM | POA: Insufficient documentation

## 2015-03-14 ENCOUNTER — Other Ambulatory Visit: Payer: Self-pay | Admitting: Family Medicine

## 2015-03-15 ENCOUNTER — Telehealth: Payer: Self-pay | Admitting: Family Medicine

## 2015-03-15 NOTE — Telephone Encounter (Signed)
We ordered TSH on patient at the beginning of July, but have not received any results. Please call to see if he has had blood drawn yet. Thanks.

## 2015-03-15 NOTE — Telephone Encounter (Signed)
LMOVM for pt to return call 

## 2015-03-18 LAB — TSH: TSH: 1.77 u[IU]/mL (ref 0.450–4.500)

## 2015-03-19 NOTE — Telephone Encounter (Signed)
Patient had tsh labs done and has been advised of results.

## 2015-03-25 DIAGNOSIS — Z7982 Long term (current) use of aspirin: Secondary | ICD-10-CM | POA: Diagnosis not present

## 2015-03-25 DIAGNOSIS — E079 Disorder of thyroid, unspecified: Secondary | ICD-10-CM | POA: Diagnosis not present

## 2015-03-25 DIAGNOSIS — Z886 Allergy status to analgesic agent status: Secondary | ICD-10-CM | POA: Diagnosis not present

## 2015-03-25 DIAGNOSIS — H9190 Unspecified hearing loss, unspecified ear: Secondary | ICD-10-CM | POA: Diagnosis not present

## 2015-03-25 DIAGNOSIS — K219 Gastro-esophageal reflux disease without esophagitis: Secondary | ICD-10-CM | POA: Diagnosis not present

## 2015-03-25 DIAGNOSIS — Z91010 Allergy to peanuts: Secondary | ICD-10-CM | POA: Diagnosis not present

## 2015-03-25 DIAGNOSIS — M1990 Unspecified osteoarthritis, unspecified site: Secondary | ICD-10-CM | POA: Diagnosis not present

## 2015-03-25 DIAGNOSIS — H2512 Age-related nuclear cataract, left eye: Secondary | ICD-10-CM | POA: Diagnosis present

## 2015-03-25 DIAGNOSIS — I10 Essential (primary) hypertension: Secondary | ICD-10-CM | POA: Diagnosis not present

## 2015-03-25 DIAGNOSIS — Z79899 Other long term (current) drug therapy: Secondary | ICD-10-CM | POA: Diagnosis not present

## 2015-03-25 DIAGNOSIS — G4730 Sleep apnea, unspecified: Secondary | ICD-10-CM | POA: Diagnosis not present

## 2015-03-30 ENCOUNTER — Ambulatory Visit: Payer: BLUE CROSS/BLUE SHIELD | Admitting: Anesthesiology

## 2015-03-30 ENCOUNTER — Other Ambulatory Visit: Payer: Self-pay | Admitting: Family Medicine

## 2015-03-30 ENCOUNTER — Encounter: Payer: Self-pay | Admitting: *Deleted

## 2015-03-30 ENCOUNTER — Encounter
Admission: RE | Disposition: A | Discharge status: Home / Self Care | Payer: Self-pay | Source: Ambulatory Visit | Attending: Ophthalmology

## 2015-03-30 ENCOUNTER — Ambulatory Visit
Admission: RE | Admit: 2015-03-30 | Discharge: 2015-03-30 | Disposition: A | Discharge status: Home / Self Care | Payer: BLUE CROSS/BLUE SHIELD | Source: Ambulatory Visit | Attending: Ophthalmology | Admitting: Ophthalmology

## 2015-03-30 DIAGNOSIS — H2512 Age-related nuclear cataract, left eye: Secondary | ICD-10-CM | POA: Diagnosis not present

## 2015-03-30 DIAGNOSIS — E079 Disorder of thyroid, unspecified: Secondary | ICD-10-CM | POA: Insufficient documentation

## 2015-03-30 DIAGNOSIS — K219 Gastro-esophageal reflux disease without esophagitis: Secondary | ICD-10-CM | POA: Insufficient documentation

## 2015-03-30 DIAGNOSIS — Z7982 Long term (current) use of aspirin: Secondary | ICD-10-CM | POA: Insufficient documentation

## 2015-03-30 DIAGNOSIS — M1990 Unspecified osteoarthritis, unspecified site: Secondary | ICD-10-CM | POA: Insufficient documentation

## 2015-03-30 DIAGNOSIS — I10 Essential (primary) hypertension: Secondary | ICD-10-CM | POA: Insufficient documentation

## 2015-03-30 DIAGNOSIS — Z79899 Other long term (current) drug therapy: Secondary | ICD-10-CM | POA: Insufficient documentation

## 2015-03-30 DIAGNOSIS — H9190 Unspecified hearing loss, unspecified ear: Secondary | ICD-10-CM | POA: Insufficient documentation

## 2015-03-30 DIAGNOSIS — Z886 Allergy status to analgesic agent status: Secondary | ICD-10-CM | POA: Insufficient documentation

## 2015-03-30 DIAGNOSIS — G4730 Sleep apnea, unspecified: Secondary | ICD-10-CM | POA: Insufficient documentation

## 2015-03-30 DIAGNOSIS — Z91010 Allergy to peanuts: Secondary | ICD-10-CM | POA: Insufficient documentation

## 2015-03-30 HISTORY — PX: CATARACT EXTRACTION W/PHACO: SHX586

## 2015-03-30 SURGERY — PHACOEMULSIFICATION, CATARACT, WITH IOL INSERTION
Anesthesia: Monitor Anesthesia Care | Site: Eye | Laterality: Left | Wound class: Clean

## 2015-03-30 MED ORDER — EPINEPHRINE HCL 1 MG/ML IJ SOLN
INTRAMUSCULAR | Status: AC
  Filled 2015-03-30: qty 1

## 2015-03-30 MED ORDER — CEFUROXIME OPHTHALMIC INJECTION 1 MG/0.1 ML
INJECTION | OPHTHALMIC | Status: AC
  Filled 2015-03-30: qty 0.1

## 2015-03-30 MED ORDER — TETRACAINE HCL 0.5 % OP SOLN
OPHTHALMIC | Status: AC
  Administered 2015-03-30: 1 [drp] via OPHTHALMIC
  Filled 2015-03-30: qty 2

## 2015-03-30 MED ORDER — TETRACAINE HCL 0.5 % OP SOLN
1.0000 [drp] | OPHTHALMIC | Status: AC | PRN
  Administered 2015-03-30: 1 [drp] via OPHTHALMIC

## 2015-03-30 MED ORDER — POVIDONE-IODINE 5 % OP SOLN
OPHTHALMIC | Status: AC
  Administered 2015-03-30: 1 via OPHTHALMIC
  Filled 2015-03-30: qty 30

## 2015-03-30 MED ORDER — MOXIFLOXACIN HCL 0.5 % OP SOLN: 1.0000 [drp] | OPHTHALMIC | Status: DC | PRN

## 2015-03-30 MED ORDER — NA CHONDROIT SULF-NA HYALURON 40-17 MG/ML IO SOLN
INTRAOCULAR | Status: DC | PRN
  Administered 2015-03-30: 1 mL via INTRAOCULAR

## 2015-03-30 MED ORDER — NA CHONDROIT SULF-NA HYALURON 40-17 MG/ML IO SOLN
INTRAOCULAR | Status: AC
  Filled 2015-03-30: qty 1

## 2015-03-30 MED ORDER — POVIDONE-IODINE 5 % OP SOLN
1.0000 "application " | OPHTHALMIC | Status: AC | PRN
  Administered 2015-03-30: 1 via OPHTHALMIC

## 2015-03-30 MED ORDER — ARMC OPHTHALMIC DILATING GEL
OPHTHALMIC | Status: AC
  Administered 2015-03-30: 1 via OPHTHALMIC
  Filled 2015-03-30: qty 0.25

## 2015-03-30 MED ORDER — SODIUM CHLORIDE 0.9 % IV SOLN
INTRAVENOUS | Status: DC
  Administered 2015-03-30 (×2): via INTRAVENOUS

## 2015-03-30 MED ORDER — ARMC OPHTHALMIC DILATING GEL
1.0000 "application " | OPHTHALMIC | Status: DC | PRN
  Administered 2015-03-30: 1 via OPHTHALMIC

## 2015-03-30 MED ORDER — MIDAZOLAM HCL 2 MG/2ML IJ SOLN
INTRAMUSCULAR | Status: DC | PRN
  Administered 2015-03-30 (×2): 1 mg via INTRAVENOUS

## 2015-03-30 MED ORDER — FENTANYL CITRATE (PF) 100 MCG/2ML IJ SOLN
INTRAMUSCULAR | Status: DC | PRN
  Administered 2015-03-30 (×2): 50 ug via INTRAVENOUS

## 2015-03-30 MED ORDER — MOXIFLOXACIN HCL 0.5 % OP SOLN
OPHTHALMIC | Status: AC
  Filled 2015-03-30: qty 3

## 2015-03-30 MED ORDER — EPINEPHRINE HCL 1 MG/ML IJ SOLN
INTRAMUSCULAR | Status: DC | PRN
  Administered 2015-03-30: 200 mL via OPHTHALMIC

## 2015-03-30 MED ORDER — MOXIFLOXACIN HCL 0.5 % OP SOLN
OPHTHALMIC | Status: DC | PRN
  Administered 2015-03-30: 4 [drp] via OPHTHALMIC

## 2015-03-30 SURGICAL SUPPLY — 23 items
CANNULA ANT/CHMB 27GA (MISCELLANEOUS) ×2 IMPLANT
CUP MEDICINE 2OZ PLAST GRAD ST (MISCELLANEOUS) ×2 IMPLANT
GLOVE BIO SURGEON STRL SZ8 (GLOVE) ×2 IMPLANT
GLOVE BIOGEL M 6.5 STRL (GLOVE) ×2 IMPLANT
GLOVE SURG LX 8.0 MICRO (GLOVE) ×1
GLOVE SURG LX STRL 8.0 MICRO (GLOVE) ×1 IMPLANT
GOWN STRL REUS W/ TWL LRG LVL3 (GOWN DISPOSABLE) ×2 IMPLANT
GOWN STRL REUS W/TWL LRG LVL3 (GOWN DISPOSABLE) ×2
LENS IOL TECNIS TRC I 225 24.5 (Intraocular Lens) ×1 IMPLANT
LENS IOL TORIC 225 24.5 (Intraocular Lens) ×1 IMPLANT
LENS IOL TORIC 24.5 (Intraocular Lens) ×1 IMPLANT
PACK CATARACT (MISCELLANEOUS) ×2 IMPLANT
PACK CATARACT BRASINGTON LX (MISCELLANEOUS) ×2 IMPLANT
PACK EYE AFTER SURG (MISCELLANEOUS) ×2 IMPLANT
SOL BSS BAG (MISCELLANEOUS) ×2
SOL PREP PVP 2OZ (MISCELLANEOUS) ×2
SOLUTION BSS BAG (MISCELLANEOUS) ×1 IMPLANT
SOLUTION PREP PVP 2OZ (MISCELLANEOUS) ×1 IMPLANT
SYR 3ML LL SCALE MARK (SYRINGE) ×2 IMPLANT
SYR 5ML LL (SYRINGE) ×2 IMPLANT
SYR TB 1ML 27GX1/2 LL (SYRINGE) ×2 IMPLANT
WATER STERILE IRR 1000ML POUR (IV SOLUTION) ×2 IMPLANT
WIPE NON LINTING 3.25X3.25 (MISCELLANEOUS) ×2 IMPLANT

## 2015-03-30 NOTE — Anesthesia Procedure Notes (Signed)
Date/Time: 03/30/2015 8:03 AM Performed by: Junious Silk Pre-anesthesia Checklist: Patient identified, Emergency Drugs available, Suction available, Patient being monitored and Timeout performed Oxygen Delivery Method: Nasal cannula

## 2015-03-30 NOTE — Discharge Instructions (Signed)
AMBULATORY SURGERY  DISCHARGE INSTRUCTIONS   1) The drugs that you were given will stay in your system until tomorrow so for the next 24 hours you should not:  A) Drive an automobile B) Make any legal decisions C) Drink any alcoholic beverage   2) You may resume regular meals tomorrow.  Today it is better to start with liquids and gradually work up to solid foods.  You may eat anything you prefer, but it is better to start with liquids, then soup and crackers, and gradually work up to solid foods.   3) Please notify your doctor immediately if you have any unusual bleeding, trouble breathing, redness and pain at the surgery site, drainage, fever, or pain not relieved by medication. 4)   5) Your post-operative visit with Dr.                                     is: Date:                        Time:    Please call to schedule your post-operative visit.  6) Additional Instructions:    Cataract Surgery Care After Refer to this sheet in the next few weeks. These instructions provide you with information on caring for yourself after your procedure. Your caregiver may also give you more specific instructions. Your treatment has been planned according to current medical practices, but problems sometimes occur. Call your caregiver if you have any problems or questions after your procedure.  HOME CARE INSTRUCTIONS   Avoid strenuous activities as directed by your caregiver.  Ask your caregiver when you can resume driving.  Use eyedrops or other medicines to help healing and control pressure inside your eye as directed by your caregiver.  Only take over-the-counter or prescription medicines for pain, discomfort, or fever as directed by your caregiver.  Do not to touch or rub your eyes.  You may be instructed to use a protective shield during the first few days and nights after surgery. If not, wear sunglasses to protect your eyes. This is to protect the eye from pressure or from being  accidentally bumped.  Keep the area around your eye clean and dry. Avoid swimming or allowing water to hit you directly in the face while showering. Keep soap and shampoo out of your eyes.  Do not bend or lift heavy objects. Bending increases pressure in the eye. You can walk, climb stairs, and do light household chores.  Do not put a contact lens into the eye that had surgery until your caregiver says it is okay to do so.  Ask your doctor when you can return to work. This will depend on the kind of work that you do. If you work in a dusty environment, you may be advised to wear protective eyewear for a period of time.  Ask your caregiver when it will be safe to engage in sexual activity.  Continue with your regular eye exams as directed by your caregiver. What to expect:  It is normal to feel itching and mild discomfort for a few days after cataract surgery. Some fluid discharge is also common, and your eye may be sensitive to light and touch.  After 1 to 2 days, even moderate discomfort should disappear. In most cases, healing will take about 6 weeks.  If you received an intraocular lens (IOL), you may notice  that colors are very bright or have a blue tinge. Also, if you have been in bright sunlight, everything may appear reddish for a few hours. If you see these color tinges, it is because your lens is clear and no longer cloudy. Within a few months after receiving an IOL, these extra colors should go away. When you have healed, you will probably need new glasses. SEEK MEDICAL CARE IF:   You have increased bruising around your eye.  You have discomfort not helped by medicine. SEEK IMMEDIATE MEDICAL CARE IF:   You have a fever.  You have a worsening or sudden vision loss.  You have redness, swelling, or increasing pain in the eye.  You have a thick discharge from the eye that had surgery. MAKE SURE YOU:  Understand these instructions.  Will watch your condition.  Will get  help right away if you are not doing well or get worse. Document Released: 03/03/2005 Document Revised: 11/06/2011 Document Reviewed: 04/07/2011 [facility] Patient Information 2015 [place], [place]. This information is not intended to replace advice given to you by your health care provider. Make sure you discuss any questions you have with your health care provider.

## 2015-03-30 NOTE — Anesthesia Preprocedure Evaluation (Signed)
Anesthesia Evaluation  Patient identified by MRN, date of birth, ID band Patient awake    Reviewed: Allergy & Precautions, H&P , NPO status , Patient's Chart, lab work & pertinent test results, reviewed documented beta blocker date and time   Airway Mallampati: II  TM Distance: >3 FB Neck ROM: full    Dental no notable dental hx.    Pulmonary neg shortness of breath, sleep apnea and Continuous Positive Airway Pressure Ventilation , neg COPDneg recent URI,  breath sounds clear to auscultation  Pulmonary exam normal       Cardiovascular Exercise Tolerance: Good hypertension, On Medications - angina- CAD, - Past MI, - Cardiac Stents and - CABG negative cardio ROS Normal cardiovascular exam+ dysrhythmias Rhythm:regular Rate:Normal     Neuro/Psych negative neurological ROS  negative psych ROS   GI/Hepatic Neg liver ROS, GERD-  Medicated,  Endo/Other  neg diabetesHypothyroidism   Renal/GU CRFRenal disease  negative genitourinary   Musculoskeletal   Abdominal   Peds  Hematology negative hematology ROS (+)   Anesthesia Other Findings Past Medical History:   Sleep apnea                                                  Dysrhythmia                                                  Hypertension                                                 GERD (gastroesophageal reflux disease)                       Arthritis                                                    HOH (hard of hearing)                                        Chronic kidney disease                                         Comment:stones   Prostate disease                                             Hypothyroidism                                               Reproductive/Obstetrics negative OB ROS  Anesthesia Physical Anesthesia Plan  ASA: III  Anesthesia Plan: MAC   Post-op Pain Management:     Induction:   Airway Management Planned:   Additional Equipment:   Intra-op Plan:   Post-operative Plan:   Informed Consent: I have reviewed the patients History and Physical, chart, labs and discussed the procedure including the risks, benefits and alternatives for the proposed anesthesia with the patient or authorized representative who has indicated his/her understanding and acceptance.   Dental Advisory Given  Plan Discussed with: Anesthesiologist, CRNA and Surgeon  Anesthesia Plan Comments:         Anesthesia Quick Evaluation

## 2015-03-30 NOTE — Anesthesia Postprocedure Evaluation (Signed)
  Anesthesia Post-op Note  Patient: [name] [name]  Procedure(s) Performed: Procedure(s) with comments: CATARACT EXTRACTION PHACO AND INTRAOCULAR LENS PLACEMENT (IOC) (Left) - [place]: 01:08.3 AP%: 22.1 CDE: 15.12  Fluid Lot # [id number]  Anesthesia type:MAC  Patient location: PACU  Post pain: Pain level controlled  Post assessment: Post-op Vital signs reviewed, Patient's Cardiovascular Status Stable, Respiratory Function Stable, Patent Airway and No signs of Nausea or vomiting  Post vital signs: Reviewed and stable  Last Vitals:  Filed Vitals:   03/30/15 0633  BP: 158/95  Pulse: 71  Temp: 36.4 C  Resp: 18    Level of consciousness: awake, alert  and patient cooperative  Complications: No apparent anesthesia complications

## 2015-03-30 NOTE — H&P (Signed)
  All labs reviewed. Abnormal studies sent to patients PCP when indicated.  Previous H&P reviewed, patient examined, there are NO CHANGES.  [name] [name] LOUIS8/2/20169:18 AM

## 2015-03-30 NOTE — Transfer of Care (Signed)
Immediate Anesthesia Transfer of Care Note  Patient: [name] [name]  Procedure(s) Performed: Procedure(s) with comments: CATARACT EXTRACTION PHACO AND INTRAOCULAR LENS PLACEMENT (IOC) (Left) - [place]: 01:08.3 AP%: 22.1 CDE: 15.12  Fluid Lot # [id number] H  Patient Location: PACU  Anesthesia Type:MAC  Level of Consciousness: awake, alert  and oriented  Airway & Oxygen Therapy: Patient Spontanous Breathing  Post-op Assessment: Report given to RN and Post -op Vital signs reviewed and stable  Post vital signs: Reviewed and stable  Last Vitals:  Filed Vitals:   03/30/15 0633  BP: 158/95  Pulse: 71  Temp: 36.4 C  Resp: 18    Complications: No apparent anesthesia complications

## 2015-03-30 NOTE — Op Note (Signed)
PREOPERATIVE DIAGNOSIS:  Nuclear sclerotic cataract of the left eye.   POSTOPERATIVE DIAGNOSIS:  left nuclear scleroctic cataract   OPERATIVE PROCEDURE:  Procedure(s): CATARACT EXTRACTION PHACO AND INTRAOCULAR LENS PLACEMENT (IOC)   SURGEON:  WilliaGalen ManilaD.   ANESTHESIA: 1.      Managed anesthesia care. 2.      Topical tetracaine drops followed by 2% Xylocaine jelly applied in the preoperative holding area.  Anesthesiologist: Lenard Simmer, MD CRNA: Junious Silk, CRNA   COMPLICATIONS:  None.   TECHNIQUE:   Stop and chop    DESCRIPTION OF PROCEDURE:  The patient was examined and consented in the preoperative holding area where the aforementioned topical anesthesia was applied to the left eye.  The patient was brought back to the Operating Room where he was sat upright on the gurney and given a target to fixate upon while the eye was marked at the 3:00 and 9:00 position.  The patient was then reclined on the operating table.  The eye was prepped and draped in the usual sterile ophthalmic fashion and a lid speculum was placed. A paracentesis was created with the side port blade and the anterior chamber was filled with viscoelastic. A near clear corneal incision was performed with the steel keratome. A continuous curvilinear capsulorrhexis was performed with a cystotome followed by the capsulorrhexis forceps. Hydrodissection and hydrodelineation were carried out with BSS on a blunt cannula. The lens was removed in a stop and chop technique and the remaining cortical material was removed with the irrigation-aspiration handpiece. The eye was inflated with viscoelastic and the ZCT lens  was placed in the eye and rotated to within a few degrees of the predetermined orientation.  The remaining viscoelastic was removed from the eye.  The Sinskey hook was used to rotate the toric lens into its final resting place at 070 degrees.  0.1 mL of cefuroxime concentration 10 mg/mL was placed in the anterior  chamber. The eye was inflated to a physiologic pressure and found to be watertight.  The eye was dressed with Vigamox. The patient was given protective glasses to wear throughout the day and a shield with which to sleep tonight. The patient was also given drops with which to begin a drop regimen today and will follow-up with me in one day.  Implant Name Type Inv. Item Serial No. Manufacturer Lot No. LRB No. Used  LENS IOL TORIC 24.5 - [id number] Intraocular Lens LENS IOL TORIC 24.5 [id number]   Left 1   Procedure(s) with comments: CATARACT EXTRACTION PHACO AND INTRAOCULAR LENS PLACEMENT (IOC) (Left) - [place]: 01:08.3 AP%: 22.1 CDE: 15.12  Fluid Lot # [id number]  Electronically signed: Ishita Mcnerney LOUIS 03/30/2015 9:18 AM

## 2015-04-09 ENCOUNTER — Other Ambulatory Visit: Payer: Self-pay | Admitting: Family Medicine

## 2015-04-13 ENCOUNTER — Encounter: Payer: Self-pay | Admitting: Family Medicine

## 2015-04-13 ENCOUNTER — Ambulatory Visit (INDEPENDENT_AMBULATORY_CARE_PROVIDER_SITE_OTHER): Payer: BLUE CROSS/BLUE SHIELD | Admitting: Family Medicine

## 2015-04-13 VITALS — BP 120/80 | HR 96 | Temp 98.3°F | Resp 16 | Wt 263.0 lb

## 2015-04-13 DIAGNOSIS — Z9989 Dependence on other enabling machines and devices: Secondary | ICD-10-CM

## 2015-04-13 DIAGNOSIS — R740 Nonspecific elevation of levels of transaminase and lactic acid dehydrogenase [LDH]: Secondary | ICD-10-CM

## 2015-04-13 DIAGNOSIS — E039 Hypothyroidism, unspecified: Secondary | ICD-10-CM | POA: Diagnosis not present

## 2015-04-13 DIAGNOSIS — R7401 Elevation of levels of liver transaminase levels: Secondary | ICD-10-CM | POA: Insufficient documentation

## 2015-04-13 DIAGNOSIS — N200 Calculus of kidney: Secondary | ICD-10-CM | POA: Insufficient documentation

## 2015-04-13 DIAGNOSIS — E669 Obesity, unspecified: Secondary | ICD-10-CM | POA: Insufficient documentation

## 2015-04-13 DIAGNOSIS — G4710 Hypersomnia, unspecified: Secondary | ICD-10-CM | POA: Insufficient documentation

## 2015-04-13 DIAGNOSIS — Z87442 Personal history of urinary calculi: Secondary | ICD-10-CM | POA: Insufficient documentation

## 2015-04-13 DIAGNOSIS — G4733 Obstructive sleep apnea (adult) (pediatric): Secondary | ICD-10-CM

## 2015-04-13 DIAGNOSIS — E785 Hyperlipidemia, unspecified: Secondary | ICD-10-CM | POA: Insufficient documentation

## 2015-04-13 NOTE — Progress Notes (Signed)
Patient: [name] [name] Male    DOB: [birth year]   64 y.o.   MRN: [id number] Visit Date: 04/13/2015  Today's Provider: Mila Merry, MD   Chief Complaint  Patient presents with  . Sleep Apnea   Subjective:    HPI Obstructive Sleep Apnea: Patient was last seen in the office on 12/16/2014 for evaluation of Hypersomnia. Management at that visit includes ordering a sleep study which showed Obstructive sleep apnea. Sleep study was done on 01/08/2015. Patient was then started on CPAP. Today patient comes in reporting that since starting the CPAP he feels more restful but is still tied at times. He states he is able to sleep longer throughout the night. Patient states he has adjusted to the CPAP device and it tolerating it well. He is using it every nigh. He does not feel sleepy at all during the day anymore, but is a little fatigued. Patient reports he is not using the humidifier on the CPAP machine right now. Patient states he usually has to get up once at night to go to the restroom.   Hypothyroid Lab Results  Component Value Date   TSH 1.770 03/17/2015   He feels well and is less fatigued since increasing levothyroxine to 50 mcg a day in the Spring.      Allergies  Allergen Reactions  . Crestor  [Rosuvastatin Calcium]     Other reaction(s): Muscle Pain (severe)  . Ibuprofen   . Penicillins   . Tylenol [Acetaminophen]    Previous Medications   ASPIRIN 81 MG TABLET    Take 81 mg by mouth daily.   DOCUSATE SODIUM (COLACE) 100 MG CAPSULE    Take 100 mg by mouth daily.    LEVOTHYROXINE (SYNTHROID, LEVOTHROID) 50 MCG TABLET    TAKE 1 TABLET BY MOUTH EVERY DAY   LOSARTAN (COZAAR) 50 MG TABLET    TAKE ONE TABLET BY MOUTH ONE TIME DAILY   MULTIPLE VITAMINS-MINERALS (MULTIVITAMIN ADULT PO)    Take 1 tablet by mouth daily.   RANITIDINE (ZANTAC) 150 MG TABLET    Take 150 mg by mouth daily.   TADALAFIL (CIALIS) 5 MG TABLET    Take 5 mg by mouth daily as needed for erectile dysfunction.      Review of Systems  Constitutional: Positive for fatigue (improved since starting CPAP). Negative for fever, chills and appetite change.  Respiratory: Negative for chest tightness, shortness of breath and wheezing.   Cardiovascular: Negative for chest pain and palpitations.  Gastrointestinal: Negative for nausea, vomiting and abdominal pain.    Social History  Substance Use Topics  . Smoking status: Never Smoker   . Smokeless tobacco: Not on file  . Alcohol Use: 0.0 oz/week    0 Standard drinks or equivalent per week     Comment: moderate use; 8-9 drinks a week   Objective:   BP 120/80 mmHg  Pulse 96  Temp(Src) 98.3 F (36.8 C) (Oral)  Resp 16  Wt 263 lb (119.296 kg)  SpO2 94%  Physical Exam  General Appearance:    Alert, cooperative, no distress, obese  Eyes:    PERRL, conjunctiva/corneas clear, EOM's intact       Lungs:     Clear to auscultation bilaterally, respirations unlabored  Heart:    Regular rate and rhythm  Neurologic:   Awake, alert, oriented x 3. No apparent focal neurological           defect.  Assessment & Plan:     1. Hypothyroidism, unspecified hypothyroidism type Doing well on current thyroid replacement.   2. Obstructive sleep apnea Feel more energetic, less sleepy, and generally better since starting CPAP which he is using consistently.    3. Obesity Discussed health benefits of physical activity, and encouraged him to engage in regular exercise appropriate for his age and condition.        Mila Merry, MD  [facility]

## 2015-04-21 ENCOUNTER — Encounter: Payer: Self-pay | Admitting: Family Medicine

## 2015-06-01 ENCOUNTER — Other Ambulatory Visit: Payer: Self-pay | Admitting: Urology

## 2015-06-01 ENCOUNTER — Ambulatory Visit (INDEPENDENT_AMBULATORY_CARE_PROVIDER_SITE_OTHER): Payer: BLUE CROSS/BLUE SHIELD | Admitting: Family Medicine

## 2015-06-01 ENCOUNTER — Encounter: Payer: Self-pay | Admitting: Family Medicine

## 2015-06-01 ENCOUNTER — Ambulatory Visit
Admission: RE | Admit: 2015-06-01 | Discharge: 2015-06-01 | Disposition: A | Discharge status: Home / Self Care | Payer: BLUE CROSS/BLUE SHIELD | Source: Ambulatory Visit | Attending: Family Medicine | Admitting: Family Medicine

## 2015-06-01 ENCOUNTER — Telehealth: Payer: Self-pay

## 2015-06-01 ENCOUNTER — Ambulatory Visit
Admission: RE | Admit: 2015-06-01 | Discharge: 2015-06-01 | Disposition: A | Discharge status: Home / Self Care | Payer: BLUE CROSS/BLUE SHIELD | Source: Ambulatory Visit | Attending: Urology | Admitting: Urology

## 2015-06-01 VITALS — BP 108/84 | HR 111 | Temp 100.8°F | Resp 16 | Wt 264.8 lb

## 2015-06-01 DIAGNOSIS — R509 Fever, unspecified: Secondary | ICD-10-CM | POA: Insufficient documentation

## 2015-06-01 DIAGNOSIS — R059 Cough, unspecified: Secondary | ICD-10-CM

## 2015-06-01 DIAGNOSIS — M47894 Other spondylosis, thoracic region: Secondary | ICD-10-CM | POA: Insufficient documentation

## 2015-06-01 DIAGNOSIS — Z87442 Personal history of urinary calculi: Secondary | ICD-10-CM | POA: Diagnosis present

## 2015-06-01 DIAGNOSIS — R05 Cough: Secondary | ICD-10-CM | POA: Diagnosis not present

## 2015-06-01 MED ORDER — AZITHROMYCIN 250 MG PO TABS: ORAL_TABLET | ORAL | Status: DC

## 2015-06-01 NOTE — Telephone Encounter (Signed)
Patient has been advised. KW 

## 2015-06-01 NOTE — Patient Instructions (Addendum)
We will call you with the x-ray report. Use Delsym for cough and Mucinex or Robitussin for expectorant.

## 2015-06-01 NOTE — Progress Notes (Signed)
Subjective:     Patient ID: Eivan Gallina, male   DOB: Jan 24, 1951, 64 y.o.   MRN: [id number]  HPI  Chief Complaint  Patient presents with  . Cough    Patient comes in office today with concerns of cough for the past month, patient reports having a low grade fever intermittent during these weeks. Today patient reports that his fever had got up to 100. Associated symptoms include weakness and fatigue. Patient reports taking optc Mucinex DM for relief.   States had cold sx initially which resolved except for persistent dry cough. Reports resumption of aches, chills, fatigue and fevers since 10/1.   Review of Systems  Respiratory:       On C-Pap.       Objective:   Physical Exam  Constitutional: He appears well-developed and well-nourished. No distress (lying on exam table on presentation).  Ears: T.M's intact without inflammation Throat: no tonsillar enlargement or exudate Neck: no cervical adenopathy Lungs: clear     Assessment:    1. Cough: evaluate for pneumonia - DG Chest 2 View; Future - azithromycin (ZITHROMAX) 250 MG tablet; Two pills today then one pill daily x 4 days  Dispense: 6 tablet; Refill: 0    Plan:    Discussed use of Mucinex and Delsym.

## 2015-06-01 NOTE — Telephone Encounter (Signed)
-----   Message from Anola Gurney, [place] sent at 06/01/2015  2:31 PM EDT ----- No pneumonia

## 2015-06-04 ENCOUNTER — Encounter: Payer: Self-pay | Admitting: Urology

## 2015-06-04 ENCOUNTER — Ambulatory Visit (INDEPENDENT_AMBULATORY_CARE_PROVIDER_SITE_OTHER): Payer: BLUE CROSS/BLUE SHIELD | Admitting: Urology

## 2015-06-04 VITALS — BP 146/83 | HR 93 | Ht 74.5 in | Wt 261.3 lb

## 2015-06-04 DIAGNOSIS — N400 Enlarged prostate without lower urinary tract symptoms: Secondary | ICD-10-CM | POA: Diagnosis not present

## 2015-06-04 DIAGNOSIS — R972 Elevated prostate specific antigen [PSA]: Secondary | ICD-10-CM

## 2015-06-04 DIAGNOSIS — N281 Cyst of kidney, acquired: Secondary | ICD-10-CM

## 2015-06-04 DIAGNOSIS — Q6100 Congenital renal cyst, unspecified: Secondary | ICD-10-CM | POA: Diagnosis not present

## 2015-06-04 DIAGNOSIS — N200 Calculus of kidney: Secondary | ICD-10-CM

## 2015-06-04 MED ORDER — TADALAFIL 5 MG PO TABS: 5.0000 mg | ORAL_TABLET | Freq: Every day | ORAL | Status: DC

## 2015-06-04 NOTE — Progress Notes (Signed)
06/04/2015 3:06 PM   [name] [name] 02-Jun-1951 [id number]  Referring provider: Malva Limes, MD [address] [place], [place] [address]  Chief Complaint  Patient presents with  . Follow-up    yearly f/u of kidney stones    HPI: The patient is a 64 year old gentleman that presents for follow-up. His wound followed for nephrolithiasis and obstructing stone in 2015 requiring ureteroscopy. He also 24 urinalysis done last year that showed hypocitraturia and low urine output. He was recommended to start potassium citrate at that time, but he did not want to start a new medication. He has done better with drinking more fluid and drinking more fluids high in citric acid such as lemonade.. He had a KUB done. It shows a small approximate 2 mm nonobstructing left renal stone. He has passed no stones in the past year.  He has a history of erectile dysfunction. Cialis 5 mg seems work very well for him.  He also has BPH and she also takes a Cialis 4. He has some urgency and some frequency, but his eye PSS scores 8/1. He is very happy with the treatment right now. His PVR is 130. We're unable to get a uroflow on him today as he had already voided prior to me seeing him in the office today. He was started on Flomax his last visit but stopped it due to anorgasmia.  His history of bilateral Bosniak 2 renal cyst which required no further workup.    PMH: Past Medical History  Diagnosis Date  . Sleep apnea   . Dysrhythmia   . Hypertension   . GERD (gastroesophageal reflux disease)   . Arthritis   . HOH (hard of hearing)   . Chronic kidney disease     stones  . Prostate disease   . Hypothyroidism     Surgical History: Past Surgical History  Procedure Laterality Date  . Arm skin lesion biopsy / excision    . Kidney stone surgery    . Colonoscopy    . Incision and drainage      anal infection  . Cataract extraction w/phaco Right 02/23/2015    Procedure: CATARACT EXTRACTION  PHACO AND INTRAOCULAR LENS PLACEMENT (IOC);  Surgeon: Galen Manila, MD;  Location: [facility] ORS;  Service: Ophthalmology;  Laterality: Right;  [place] 01:01 AP%23.5 CDE 14.49 fluid pack lot #[id number]  . Cataract extraction w/phaco Left 03/30/2015    Procedure: CATARACT EXTRACTION PHACO AND INTRAOCULAR LENS PLACEMENT (IOC);  Surgeon: Galen Manila, MD;  Location: [facility] ORS;  Service: Ophthalmology;  Laterality: Left;  [place]: 01:08.3 AP%: 22.1 CDE: 15.12  Fluid Lot # [id number]  . Right ureterolithiasis with stent placement  04/29/2014    Dr. Sheppard Penton    Home Medications:    Medication List       This list is accurate as of: 06/04/15  3:06 PM.  Always use your most recent med list.               aspirin 81 MG tablet  Take 81 mg by mouth daily.     azithromycin 250 MG tablet  Commonly known as:  ZITHROMAX  Two pills today then one pill daily x 4 days     docusate sodium 100 MG capsule  Commonly known as:  COLACE  Take 100 mg by mouth daily.     levothyroxine 50 MCG tablet  Commonly known as:  SYNTHROID, LEVOTHROID  TAKE 1 TABLET BY MOUTH EVERY DAY     losartan 50 MG  tablet  Commonly known as:  COZAAR  TAKE ONE TABLET BY MOUTH ONE TIME DAILY     MULTIVITAMIN ADULT PO  Take 1 tablet by mouth daily.     ranitidine 150 MG tablet  Commonly known as:  ZANTAC  Take 150 mg by mouth daily.     tadalafil 5 MG tablet  Commonly known as:  CIALIS  Take 1 tablet (5 mg total) by mouth daily.        Allergies:  Allergies  Allergen Reactions  . Crestor  [Rosuvastatin Calcium]     Other reaction(s): Muscle Pain (severe)  . Ibuprofen   . Penicillins   . Tylenol [Acetaminophen]     Family History: Family History  Problem Relation Age of Onset  . Pancreatic cancer Mother   . Neuropathy Father   . Throat cancer Father   . Lung cancer Father     Social History:  reports that he has never smoked. He does not have any smokeless tobacco history on file. He reports that he drinks  alcohol. He reports that he does not use illicit drugs.  ROS: UROLOGY Frequent Urination?: Yes Hard to postpone urination?: Yes Burning/pain with urination?: No Get up at night to urinate?: No Leakage of urine?: No Urine stream starts and stops?: No Trouble starting stream?: No Do you have to strain to urinate?: No Blood in urine?: No Urinary tract infection?: No Sexually transmitted disease?: No Injury to kidneys or bladder?: No Painful intercourse?: No Weak stream?: No Erection problems?: No Penile pain?: No  Gastrointestinal Nausea?: No Vomiting?: No Indigestion/heartburn?: No Diarrhea?: No Constipation?: No  Constitutional Fever: Yes Night sweats?: Yes Weight loss?: No Fatigue?: Yes  Skin Skin rash/lesions?: No Itching?: No  Eyes Blurred vision?: No Double vision?: No  Ears/Nose/Throat Sore throat?: No Sinus problems?: No  Hematologic/Lymphatic Swollen glands?: No Easy bruising?: No  Cardiovascular Leg swelling?: No Chest pain?: No  Respiratory Cough?: Yes Shortness of breath?: No  Endocrine Excessive thirst?: No  Musculoskeletal Back pain?: No Joint pain?: Yes  Neurological Headaches?: No Dizziness?: No  Psychologic Depression?: No Anxiety?: No  Physical Exam: BP 146/83 mmHg  Pulse 93  Ht 6' 2.5" (1.892 m)  Wt 261 lb 4.8 oz (118.525 kg)  BMI 33.11 kg/m2  Constitutional:  Alert and oriented, No acute distress. HEENT: Secor AT, moist mucus membranes.  Trachea midline, no masses. Cardiovascular: No clubbing, cyanosis, or edema. Respiratory: Normal respiratory effort, no increased work of breathing. GI: Abdomen is soft, nontender, nondistended, no abdominal masses GU: No CVA tenderness. Normal phallus. Testicles descended equally bilaterally. Left varicocele noted. No masses. DRE: 3+, smooth, no nodules. Skin: No rashes, bruises or suspicious lesions. Lymph: No cervical or inguinal adenopathy. Neurologic: Grossly intact, no focal  deficits, moving all 4 extremities. Psychiatric: Normal mood and affect.  Laboratory Data: Lab Results  Component Value Date   WBC 5.8 05/18/2014   HGB 12.1* 05/17/2014   HCT 36.7* 05/17/2014   MCV 91 05/17/2014   PLT 205 05/17/2014    Lab Results  Component Value Date   CREATININE 1.2 01/05/2015    No results found for: PSA  No results found for: TESTOSTERONE  No results found for: HGBA1C  Urinalysis    Component Value Date/Time   COLORURINE Yellow 05/16/2014 1027   APPEARANCEUR Clear 05/16/2014 1027   LABSPEC 1.018 05/16/2014 1027   PHURINE 6.0 05/16/2014 1027   GLUCOSEU Negative 05/16/2014 1027   HGBUR 3+ 05/16/2014 1027   BILIRUBINUR Negative 05/16/2014 1027  KETONESUR Negative 05/16/2014 1027   PROTEINUR 100 mg/dL 16/05/9603 5409   NITRITE Negative 05/16/2014 1027   LEUKOCYTESUR Negative 05/16/2014 1027    Pertinent Imaging:  CLINICAL DATA: Kidney stones.  EXAM: ABDOMEN - 1 VIEW  COMPARISON: April 27, 2014.  FINDINGS: The bowel gas pattern is normal. Probable right ureteral calculus noted on prior exam is no longer visualized. Phleboliths are noted in the pelvis. New calcification is seen projected over upper pole of left kidney concerning for calculus.  IMPRESSION: Probable new left renal calculus. No evidence of bowel obstruction or ileus.     Assessment & Plan:    1. Nephrolithiasis The patient has a small 2 mm nonobstructing left renal stone that needs no intervention this time. He will continue to drink 8 glasses of water per day. He also will continue to drink fluids with high citric acid content such as lemonade. We will obtain a repeat KUB in 1 year.   2. Elevated PSA He had a previous biopsy that was negative. We'll recheck his PSA today. He will follow-up in one year for repeat DRE/PSA.  3. BPH We will continue Cialis 5 mg daily. He did not tolerate Flomax due to anorgasmia. We will check a PVR, I PSS, and uroflow at his  next appointment in one year.  4. Erectile dysfunction Continue Cialis 5 mg.  5. Renal cyst-bilateral Bosniak 2 No further work up or surveillance is necessary.   Return in about 1 year (around 06/03/2016) for with KUB prior.  IPSS, uroflow, PVR at visit.  Hildred Laser, MD  [facility] [address], [address] [place], [place] [address] [phone]

## 2015-06-05 LAB — PSA: Prostate Specific Ag, Serum: 4.4 ng/mL — ABNORMAL HIGH (ref 0.0–4.0)

## 2015-06-10 ENCOUNTER — Telehealth: Payer: Self-pay

## 2015-06-10 DIAGNOSIS — R972 Elevated prostate specific antigen [PSA]: Secondary | ICD-10-CM

## 2015-06-10 NOTE — Telephone Encounter (Signed)
Spoke with pt in reference to PSA results. Pt will RTC Tuesday for another lab draw. Orders have been placed.

## 2015-06-10 NOTE — Telephone Encounter (Signed)
-----   Message from [name], MD sent at 06/10/2015 10:04 AM EDT ----- Can you tell the patient that his PSA was slightly elevated (4.0), and have him come in for a repeat PSA? Thanks

## 2015-06-15 ENCOUNTER — Other Ambulatory Visit: Payer: BLUE CROSS/BLUE SHIELD

## 2015-06-15 DIAGNOSIS — R972 Elevated prostate specific antigen [PSA]: Secondary | ICD-10-CM

## 2015-06-15 DIAGNOSIS — N200 Calculus of kidney: Secondary | ICD-10-CM

## 2015-06-16 LAB — PSA: PROSTATE SPECIFIC AG, SERUM: 5.4 ng/mL — AB (ref 0.0–4.0)

## 2015-06-18 ENCOUNTER — Telehealth: Payer: Self-pay

## 2015-06-18 NOTE — Telephone Encounter (Signed)
Spoke with pt in reference to PSA results. Pt was transferred to the front to make f/u appt.

## 2015-06-25 ENCOUNTER — Ambulatory Visit: Payer: Self-pay

## 2015-07-02 ENCOUNTER — Ambulatory Visit (INDEPENDENT_AMBULATORY_CARE_PROVIDER_SITE_OTHER): Payer: BLUE CROSS/BLUE SHIELD | Admitting: Urology

## 2015-07-02 VITALS — BP 160/97 | HR 96 | Ht 74.0 in | Wt 264.9 lb

## 2015-07-02 DIAGNOSIS — R972 Elevated prostate specific antigen [PSA]: Secondary | ICD-10-CM

## 2015-07-02 DIAGNOSIS — Z0000 Encounter for general adult medical examination without abnormal findings: Secondary | ICD-10-CM | POA: Insufficient documentation

## 2015-07-02 DIAGNOSIS — R109 Unspecified abdominal pain: Secondary | ICD-10-CM | POA: Insufficient documentation

## 2015-07-02 NOTE — Progress Notes (Signed)
07/02/2015 11:28 AM   [name] [name] Apr 24, 1951 [id number]  Referring provider: Malva Limes, MD [address] [place], [place] [address]  Chief Complaint  Patient presents with  . Elevated PSA    HPI: The patient is a 64 year old gentleman that presents for follow-up. His wound followed for nephrolithiasis and obstructing stone in 2015 requiring ureteroscopy. He also 24 urinalysis done last year that showed hypocitraturia and low urine output. He was recommended to start potassium citrate at that time, but he did not want to start a new medication. He has done better with drinking more fluid and drinking more fluids high in citric acid such as lemonade.. He had a KUB done. It shows a small approximate 2 mm nonobstructing left renal stone. He has passed no stones in the past year.  He has a history of erectile dysfunction. Cialis 5 mg seems work very well for him.  He also has BPH and she also takes a Cialis 4. He has some urgency and some frequency, but his eye PSS scores 8/1. He is very happy with the treatment right now. His PVR is 130. We're unable to get a uroflow on him today as he had already voided prior to me seeing him in the office today. He was started on Flomax his last visit but stopped it due to anorgasmia.  His history of bilateral Bosniak 2 renal cyst which required no further workup.  Interval history: The patient has increased his PSA from 4.4-5.4. He presents today to discuss a prostate biopsy.     PMH: Past Medical History  Diagnosis Date  . Sleep apnea   . Dysrhythmia   . Hypertension   . GERD (gastroesophageal reflux disease)   . Arthritis   . HOH (hard of hearing)   . Chronic kidney disease     stones  . Prostate disease   . Hypothyroidism     Surgical History: Past Surgical History  Procedure Laterality Date  . Arm skin lesion biopsy / excision    . Kidney stone surgery    . Colonoscopy    . Incision and drainage      anal  infection  . Cataract extraction w/phaco Right 02/23/2015    Procedure: CATARACT EXTRACTION PHACO AND INTRAOCULAR LENS PLACEMENT (IOC);  Surgeon: Galen Manila, MD;  Location: [facility];  Service: Ophthalmology;  Laterality: Right;  [place] 01:01 AP%23.5 CDE 14.49 fluid pack lot #[id number]  . Cataract extraction w/phaco Left 03/30/2015    Procedure: CATARACT EXTRACTION PHACO AND INTRAOCULAR LENS PLACEMENT (IOC);  Surgeon: Galen Manila, MD;  Location: [facility];  Service: Ophthalmology;  Laterality: Left;  [place]: 01:08.3 AP%: 22.1 CDE: 15.12  Fluid Lot # [id number]  . Right ureterolithiasis with stent placement  04/29/2014    Dr. Sheppard Penton    Home Medications:    Medication List       This list is accurate as of: 07/02/15 11:28 AM.  Always use your most recent med list.               aspirin 81 MG tablet  Take 81 mg by mouth daily.     docusate sodium 100 MG capsule  Commonly known as:  COLACE  Take 100 mg by mouth daily.     levothyroxine 50 MCG tablet  Commonly known as:  SYNTHROID, LEVOTHROID  TAKE 1 TABLET BY MOUTH EVERY DAY     losartan 50 MG tablet  Commonly known as:  COZAAR  TAKE ONE TABLET BY MOUTH  ONE TIME DAILY     MULTIVITAMIN ADULT PO  Take 1 tablet by mouth daily.     ranitidine 150 MG tablet  Commonly known as:  ZANTAC  Take 150 mg by mouth daily.     tadalafil 5 MG tablet  Commonly known as:  CIALIS  Take 1 tablet (5 mg total) by mouth daily.        Allergies:  Allergies  Allergen Reactions  . Crestor  [Rosuvastatin Calcium]     Other reaction(s): Muscle Pain (severe)  . Ibuprofen   . Penicillins   . Tylenol [Acetaminophen]     Family History: Family History  Problem Relation Age of Onset  . Pancreatic cancer Mother   . Neuropathy Father   . Throat cancer Father   . Lung cancer Father     Social History:  reports that he has never smoked. He does not have any smokeless tobacco history on file. He reports that he drinks alcohol. He reports  that he does not use illicit drugs.  ROS: UROLOGY Frequent Urination?: No Hard to postpone urination?: No Burning/pain with urination?: No Get up at night to urinate?: Yes Leakage of urine?: No Urine stream starts and stops?: No Trouble starting stream?: No Do you have to strain to urinate?: No Blood in urine?: No Urinary tract infection?: No Sexually transmitted disease?: No Injury to kidneys or bladder?: No Painful intercourse?: No Weak stream?: No Erection problems?: No Penile pain?: No  Gastrointestinal Nausea?: No Vomiting?: No Indigestion/heartburn?: No Diarrhea?: No Constipation?: No  Constitutional Fever: No Night sweats?: No Weight loss?: No Fatigue?: No  Skin Skin rash/lesions?: No Itching?: No  Eyes Blurred vision?: No Double vision?: No  Ears/Nose/Throat Sore throat?: No Sinus problems?: No  Hematologic/Lymphatic Swollen glands?: No Easy bruising?: No  Cardiovascular Leg swelling?: No Chest pain?: No  Respiratory Cough?: No Shortness of breath?: No  Endocrine Excessive thirst?: No  Musculoskeletal Back pain?: No Joint pain?: No  Neurological Headaches?: No Dizziness?: No  Psychologic Depression?: No Anxiety?: No  Physical Exam: BP 160/97 mmHg  Pulse 96  Ht 6\' 2"  (1.88 m)  Wt 264 lb 14.4 oz (120.158 kg)  BMI 34.00 kg/m2  Constitutional:  Alert and oriented, No acute distress. HEENT: [place] AT, moist mucus membranes.  Trachea midline, no masses. Cardiovascular: No clubbing, cyanosis, or edema. Respiratory: Normal respiratory effort, no increased work of breathing. GI: Abdomen is soft, nontender, nondistended, no abdominal masses GU: No CVA tenderness.  Skin: No rashes, bruises or suspicious lesions. Lymph: No cervical or inguinal adenopathy. Neurologic: Grossly intact, no focal deficits, moving all 4 extremities. Psychiatric: Normal mood and affect.  Laboratory Data: Lab Results  Component Value Date   WBC 5.8  05/18/2014   HGB 12.1* 05/17/2014   HCT 36.7* 05/17/2014   MCV 91 05/17/2014   PLT 205 05/17/2014    Lab Results  Component Value Date   CREATININE 1.2 01/05/2015    Lab Results  Component Value Date   PSA 5.4* 06/15/2015   PSA 4.4* 06/04/2015    No results found for: TESTOSTERONE  No results found for: HGBA1C  Urinalysis    Component Value Date/Time   COLORURINE Yellow 05/16/2014 1027   APPEARANCEUR Clear 05/16/2014 1027   LABSPEC 1.018 05/16/2014 1027   PHURINE 6.0 05/16/2014 1027   GLUCOSEU Negative 05/16/2014 1027   HGBUR 3+ 05/16/2014 1027   BILIRUBINUR Negative 05/16/2014 1027   KETONESUR Negative 05/16/2014 1027   PROTEINUR 100 mg/dL 16/05/9603 5409   NITRITE Negative  05/16/2014 1027   LEUKOCYTESUR Negative 05/16/2014 1027      Assessment & Plan:     Expand All Collapse All     06/04/2015 3:06 PM   [name] [name] [birth year] [id number]  Referring provider: Malva Limes, MD [address] [place], [place] [address]  Chief Complaint  Patient presents with  . Follow-up    yearly f/u of kidney stones    HPI: The patient is a 64 year old gentleman that presents for follow-up. His wound followed for nephrolithiasis and obstructing stone in 2015 requiring ureteroscopy. He also 24 urinalysis done last year that showed hypocitraturia and low urine output. He was recommended to start potassium citrate at that time, but he did not want to start a new medication. He has done better with drinking more fluid and drinking more fluids high in citric acid such as lemonade.. He had a KUB done. It shows a small approximate 2 mm nonobstructing left renal stone. He has passed no stones in the past year.  He has a history of erectile dysfunction. Cialis 5 mg seems work very well for him.  He also has BPH and she also takes a Cialis 4. He has some urgency and some frequency, but his eye PSS scores 8/1. He is very happy with the treatment right now.  His PVR is 130. We're unable to get a uroflow on him today as he had already voided prior to me seeing him in the office today. He was started on Flomax his last visit but stopped it due to anorgasmia.  His history of bilateral Bosniak 2 renal cyst which required no further workup.   PMH: Past Medical History  Diagnosis Date  . Sleep apnea   . Dysrhythmia   . Hypertension   . GERD (gastroesophageal reflux disease)   . Arthritis   . HOH (hard of hearing)   . Chronic kidney disease     stones  . Prostate disease   . Hypothyroidism     Surgical History: Past Surgical History  Procedure Laterality Date  . Arm skin lesion biopsy / excision    . Kidney stone surgery    . Colonoscopy    . Incision and drainage      anal infection  . Cataract extraction w/phaco Right 02/23/2015    Procedure: CATARACT EXTRACTION PHACO AND INTRAOCULAR LENS PLACEMENT (IOC); Surgeon: Galen Manila, MD; Location: [facility]; Service: Ophthalmology; Laterality: Right; [place] 01:01 AP%23.5 CDE 14.49 fluid pack lot #[id number]  . Cataract extraction w/phaco Left 03/30/2015    Procedure: CATARACT EXTRACTION PHACO AND INTRAOCULAR LENS PLACEMENT (IOC); Surgeon: Galen Manila, MD; Location: [facility]; Service: Ophthalmology; Laterality: Left; [place]: 01:08.3 AP%: 22.1 CDE: 15.12  Fluid Lot # [id number]  . Right ureterolithiasis with stent placement  04/29/2014    Dr. Sheppard Penton    Home Medications:    Medication List       This list is accurate as of: 06/04/15 3:06 PM. Always use your most recent med list.              aspirin 81 MG tablet  Take 81 mg by mouth daily.     azithromycin 250 MG tablet  Commonly known as: ZITHROMAX  Two pills today then one pill daily x 4 days     docusate sodium 100 MG capsule  Commonly known as: COLACE  Take 100 mg by mouth daily.     levothyroxine 50 MCG  tablet  Commonly known as: SYNTHROID, LEVOTHROID  TAKE 1 TABLET  BY MOUTH EVERY DAY     losartan 50 MG tablet  Commonly known as: COZAAR  TAKE ONE TABLET BY MOUTH ONE TIME DAILY     MULTIVITAMIN ADULT PO  Take 1 tablet by mouth daily.     ranitidine 150 MG tablet  Commonly known as: ZANTAC  Take 150 mg by mouth daily.     tadalafil 5 MG tablet  Commonly known as: CIALIS  Take 1 tablet (5 mg total) by mouth daily.        Allergies:  Allergies  Allergen Reactions  . Crestor [Rosuvastatin Calcium]     Other reaction(s): Muscle Pain (severe)  . Ibuprofen   . Penicillins   . Tylenol [Acetaminophen]     Family History: Family History  Problem Relation Age of Onset  . Pancreatic cancer Mother   . Neuropathy Father   . Throat cancer Father   . Lung cancer Father     Social History:  reports that he has never smoked. He does not have any smokeless tobacco history on file. He reports that he drinks alcohol. He reports that he does not use illicit drugs.  ROS: UROLOGY Frequent Urination?: Yes Hard to postpone urination?: Yes Burning/pain with urination?: No Get up at night to urinate?: No Leakage of urine?: No Urine stream starts and stops?: No Trouble starting stream?: No Do you have to strain to urinate?: No Blood in urine?: No Urinary tract infection?: No Sexually transmitted disease?: No Injury to kidneys or bladder?: No Painful intercourse?: No Weak stream?: No Erection problems?: No Penile pain?: No  Gastrointestinal Nausea?: No Vomiting?: No Indigestion/heartburn?: No Diarrhea?: No Constipation?: No  Constitutional Fever: Yes Night sweats?: Yes Weight loss?: No Fatigue?: Yes  Skin Skin rash/lesions?: No Itching?: No  Eyes Blurred vision?: No Double vision?: No  Ears/Nose/Throat Sore throat?: No Sinus problems?: No  Hematologic/Lymphatic Swollen glands?: No Easy  bruising?: No  Cardiovascular Leg swelling?: No Chest pain?: No  Respiratory Cough?: Yes Shortness of breath?: No  Endocrine Excessive thirst?: No  Musculoskeletal Back pain?: No Joint pain?: Yes  Neurological Headaches?: No Dizziness?: No  Psychologic Depression?: No Anxiety?: No  Physical Exam: BP 146/83 mmHg  Pulse 93  Ht 6' 2.5" (1.892 m)  Wt 261 lb 4.8 oz (118.525 kg)  BMI 33.11 kg/m2  Constitutional: Alert and oriented, No acute distress. HEENT: [place] AT, moist mucus membranes. Trachea midline, no masses. Cardiovascular: No clubbing, cyanosis, or edema. Respiratory: Normal respiratory effort, no increased work of breathing. GI: Abdomen is soft, nontender, nondistended, no abdominal masses GU: No CVA tenderness. Normal phallus. Testicles descended equally bilaterally. Left varicocele noted. No masses. DRE: 3+, smooth, no nodules. Skin: No rashes, bruises or suspicious lesions. Lymph: No cervical or inguinal adenopathy. Neurologic: Grossly intact, no focal deficits, moving all 4 extremities. Psychiatric: Normal mood and affect.  Laboratory Data:  Recent Labs    Lab Results  Component Value Date   WBC 5.8 05/18/2014   HGB 12.1* 05/17/2014   HCT 36.7* 05/17/2014   MCV 91 05/17/2014   PLT 205 05/17/2014       Recent Labs    Lab Results  Component Value Date   CREATININE 1.2 01/05/2015       Recent Labs    No results found for: PSA     Recent Labs    No results found for: TESTOSTERONE     Recent Labs    No results found for: HGBA1C    Urinalysis  Labs (Brief)  Component Value Date/Time   COLORURINE Yellow 05/16/2014 1027   APPEARANCEUR Clear 05/16/2014 1027   LABSPEC 1.018 05/16/2014 1027   PHURINE 6.0 05/16/2014 1027   GLUCOSEU Negative 05/16/2014 1027   HGBUR 3+ 05/16/2014 1027   BILIRUBINUR Negative 05/16/2014 1027   KETONESUR Negative 05/16/2014 1027    PROTEINUR 100 mg/dL 13/03/6577 4696   NITRITE Negative 05/16/2014 1027   LEUKOCYTESUR Negative 05/16/2014 1027      Pertinent Imaging:  CLINICAL DATA: Kidney stones.  EXAM: ABDOMEN - 1 VIEW  COMPARISON: April 27, 2014.  FINDINGS: The bowel gas pattern is normal. Probable right ureteral calculus noted on prior exam is no longer visualized. Phleboliths are noted in the pelvis. New calcification is seen projected over upper pole of left kidney concerning for calculus.  IMPRESSION: Probable new left renal calculus. No evidence of bowel obstruction or ileus.     Assessment & Plan:   1. Nephrolithiasis The patient has a small 2 mm nonobstructing left renal stone that needs no intervention this time. He will continue to drink 8 glasses of water per day. He also will continue to drink fluids with high citric acid content such as lemonade. We will obtain a repeat KUB in 1 year.   2. Elevated PSA He had a previous biopsy that was negative. We'll recheck his PSA today. He will follow-up in one year for repeat DRE/PSA.  3. BPH We will continue Cialis 5 mg daily. He did not tolerate Flomax due to anorgasmia. We will check a PVR, I PSS, and uroflow at his next appointment in one year.  4. Erectile dysfunction Continue Cialis 5 mg.  5. Renal cyst-bilateral Bosniak 2 No further work up or surveillance is necessary.     Expand All Collapse All     06/04/2015 3:06 PM   [name] [name] [birth year] [id number]  Referring provider: Malva Limes, MD [address] [place], [place] [address]  Chief Complaint  Patient presents with  . Follow-up    yearly f/u of kidney stones    HPI: The patient is a 64 year old gentleman that presents for follow-up. His wound followed for nephrolithiasis and obstructing stone in 2015 requiring ureteroscopy. He also 24 urinalysis done last year that showed hypocitraturia and low urine output. He was  recommended to start potassium citrate at that time, but he did not want to start a new medication. He has done better with drinking more fluid and drinking more fluids high in citric acid such as lemonade.. He had a KUB done. It shows a small approximate 2 mm nonobstructing left renal stone. He has passed no stones in the past year.  He has a history of erectile dysfunction. Cialis 5 mg seems work very well for him.  He also has BPH and she also takes a Cialis 4. He has some urgency and some frequency, but his eye PSS scores 8/1. He is very happy with the treatment right now. His PVR is 130. We're unable to get a uroflow on him today as he had already voided prior to me seeing him in the office today. He was started on Flomax his last visit but stopped it due to anorgasmia.  His history of bilateral Bosniak 2 renal cyst which required no further workup.   PMH: Past Medical History  Diagnosis Date  . Sleep apnea   . Dysrhythmia   . Hypertension   . GERD (gastroesophageal reflux disease)   . Arthritis   . HOH (hard of hearing)   .  Chronic kidney disease     stones  . Prostate disease   . Hypothyroidism     Surgical History: Past Surgical History  Procedure Laterality Date  . Arm skin lesion biopsy / excision    . Kidney stone surgery    . Colonoscopy    . Incision and drainage      anal infection  . Cataract extraction w/phaco Right 02/23/2015    Procedure: CATARACT EXTRACTION PHACO AND INTRAOCULAR LENS PLACEMENT (IOC); Surgeon: Galen Manila, MD; Location: [facility]; Service: Ophthalmology; Laterality: Right; [place] 01:01 AP%23.5 CDE 14.49 fluid pack lot #[id number]  . Cataract extraction w/phaco Left 03/30/2015    Procedure: CATARACT EXTRACTION PHACO AND INTRAOCULAR LENS PLACEMENT (IOC); Surgeon: Galen Manila, MD; Location: [facility]; Service: Ophthalmology; Laterality: Left; [place]: 01:08.3 AP%:  22.1 CDE: 15.12  Fluid Lot # [id number]  . Right ureterolithiasis with stent placement  04/29/2014    Dr. Sheppard Penton    Home Medications:    Medication List       This list is accurate as of: 06/04/15 3:06 PM. Always use your most recent med list.              aspirin 81 MG tablet  Take 81 mg by mouth daily.     azithromycin 250 MG tablet  Commonly known as: ZITHROMAX  Two pills today then one pill daily x 4 days     docusate sodium 100 MG capsule  Commonly known as: COLACE  Take 100 mg by mouth daily.     levothyroxine 50 MCG tablet  Commonly known as: SYNTHROID, LEVOTHROID  TAKE 1 TABLET BY MOUTH EVERY DAY     losartan 50 MG tablet  Commonly known as: COZAAR  TAKE ONE TABLET BY MOUTH ONE TIME DAILY     MULTIVITAMIN ADULT PO  Take 1 tablet by mouth daily.     ranitidine 150 MG tablet  Commonly known as: ZANTAC  Take 150 mg by mouth daily.     tadalafil 5 MG tablet  Commonly known as: CIALIS  Take 1 tablet (5 mg total) by mouth daily.        Allergies:  Allergies  Allergen Reactions  . Crestor [Rosuvastatin Calcium]     Other reaction(s): Muscle Pain (severe)  . Ibuprofen   . Penicillins   . Tylenol [Acetaminophen]     Family History: Family History  Problem Relation Age of Onset  . Pancreatic cancer Mother   . Neuropathy Father   . Throat cancer Father   . Lung cancer Father     Social History:  reports that he has never smoked. He does not have any smokeless tobacco history on file. He reports that he drinks alcohol. He reports that he does not use illicit drugs.  ROS: UROLOGY Frequent Urination?: Yes Hard to postpone urination?: Yes Burning/pain with urination?: No Get up at night to urinate?: No Leakage of urine?: No Urine stream starts and stops?: No Trouble starting stream?: No Do you have to strain to urinate?: No Blood in  urine?: No Urinary tract infection?: No Sexually transmitted disease?: No Injury to kidneys or bladder?: No Painful intercourse?: No Weak stream?: No Erection problems?: No Penile pain?: No  Gastrointestinal Nausea?: No Vomiting?: No Indigestion/heartburn?: No Diarrhea?: No Constipation?: No  Constitutional Fever: Yes Night sweats?: Yes Weight loss?: No Fatigue?: Yes  Skin Skin rash/lesions?: No Itching?: No  Eyes Blurred vision?: No Double vision?: No  Ears/Nose/Throat Sore throat?: No Sinus problems?: No  Hematologic/Lymphatic Swollen glands?: No  Easy bruising?: No  Cardiovascular Leg swelling?: No Chest pain?: No  Respiratory Cough?: Yes Shortness of breath?: No  Endocrine Excessive thirst?: No  Musculoskeletal Back pain?: No Joint pain?: Yes  Neurological Headaches?: No Dizziness?: No  Psychologic Depression?: No Anxiety?: No  Physical Exam: BP 146/83 mmHg  Pulse 93  Ht 6' 2.5" (1.892 m)  Wt 261 lb 4.8 oz (118.525 kg)  BMI 33.11 kg/m2  Constitutional: Alert and oriented, No acute distress. HEENT: [place] AT, moist mucus membranes. Trachea midline, no masses. Cardiovascular: No clubbing, cyanosis, or edema. Respiratory: Normal respiratory effort, no increased work of breathing. GI: Abdomen is soft, nontender, nondistended, no abdominal masses GU: No CVA tenderness. Normal phallus. Testicles descended equally bilaterally. Left varicocele noted. No masses. DRE: 3+, smooth, no nodules. Skin: No rashes, bruises or suspicious lesions. Lymph: No cervical or inguinal adenopathy. Neurologic: Grossly intact, no focal deficits, moving all 4 extremities. Psychiatric: Normal mood and affect.  Laboratory Data:  Recent Labs    Lab Results  Component Value Date   WBC 5.8 05/18/2014   HGB 12.1* 05/17/2014   HCT 36.7* 05/17/2014   MCV 91 05/17/2014   PLT 205 05/17/2014       Recent Labs    Lab Results  Component  Value Date   CREATININE 1.2 01/05/2015       Recent Labs    No results found for: PSA     Recent Labs    No results found for: TESTOSTERONE     Recent Labs    No results found for: HGBA1C    Urinalysis  Labs (Brief)       Component Value Date/Time   COLORURINE Yellow 05/16/2014 1027   APPEARANCEUR Clear 05/16/2014 1027   LABSPEC 1.018 05/16/2014 1027   PHURINE 6.0 05/16/2014 1027   GLUCOSEU Negative 05/16/2014 1027   HGBUR 3+ 05/16/2014 1027   BILIRUBINUR Negative 05/16/2014 1027   KETONESUR Negative 05/16/2014 1027   PROTEINUR 100 mg/dL 16/05/9603 5409   NITRITE Negative 05/16/2014 1027   LEUKOCYTESUR Negative 05/16/2014 1027      Pertinent Imaging:  CLINICAL DATA: Kidney stones.  EXAM: ABDOMEN - 1 VIEW  COMPARISON: April 27, 2014.  FINDINGS: The bowel gas pattern is normal. Probable right ureteral calculus noted on prior exam is no longer visualized. Phleboliths are noted in the pelvis. New calcification is seen projected over upper pole of left kidney concerning for calculus.  IMPRESSION: Probable new left renal calculus. No evidence of bowel obstruction or ileus.     Assessment & Plan:   1. Elevated PSA He had a previous biopsy that was negative, however his PSA recently rose from 4.4 to 5.4. I discussed the risks, benefits, and indications for prostate biopsy. He is aware that the risks include, but are not limited to, bleeding and infection. He is to expect blood in his semen, stool, and urine. He is also aware that there is a less than 1% chance of infection requiring intravenous antibiotics. He is unsure if he wants to proceed with prostate biopsy. He was given instructions to the prostate biopsy, and he will discuss this with his wife. If he chooses to have a prostate biopsy, he will contact the office to schedule appointment.   2. Nephrolithiasis The patient has a small 2 mm  nonobstructing left renal stone that needs no intervention this time. He will continue to drink 8 glasses of water per day. He also will continue to drink fluids with high citric acid content such as  lemonade. We will obtain a repeat KUB in 1 year.   3. BPH We will continue Cialis 5 mg daily. He did not tolerate Flomax due to anorgasmia. We will check a PVR, I PSS, and uroflow at his next appointment in one year.  4. Erectile dysfunction Continue Cialis 5 mg.  5. Renal cyst-bilateral Bosniak 2 No further work up or surveillance is necessary.       There are no diagnoses linked to this encounter.  No Follow-up on file.  Hildred Laser, MD  [facility] [address] [place], [place] [address] [phone]

## 2015-07-06 ENCOUNTER — Telehealth: Payer: Self-pay | Admitting: Family Medicine

## 2015-07-06 ENCOUNTER — Other Ambulatory Visit: Payer: Self-pay | Admitting: Family Medicine

## 2015-07-06 DIAGNOSIS — R972 Elevated prostate specific antigen [PSA]: Secondary | ICD-10-CM

## 2015-07-06 NOTE — Telephone Encounter (Signed)
Have ordered a PSA

## 2015-07-08 ENCOUNTER — Other Ambulatory Visit: Payer: Self-pay | Admitting: Family Medicine

## 2015-07-09 ENCOUNTER — Telehealth: Payer: Self-pay

## 2015-07-09 LAB — PSA: PROSTATE SPECIFIC AG, SERUM: 6.6 ng/mL — AB (ref 0.0–4.0)

## 2015-07-09 NOTE — Telephone Encounter (Signed)
-----   Message from Anola Gurneyobert Chauvin, [place] sent at 07/09/2015  7:37 AM EST ----- PSA is higher.

## 2015-07-09 NOTE — Telephone Encounter (Signed)
Patient was advised of lab report. KW 

## 2015-07-29 ENCOUNTER — Ambulatory Visit (INDEPENDENT_AMBULATORY_CARE_PROVIDER_SITE_OTHER): Payer: BLUE CROSS/BLUE SHIELD | Admitting: Urology

## 2015-07-29 ENCOUNTER — Other Ambulatory Visit: Payer: Self-pay | Admitting: Urology

## 2015-07-29 VITALS — BP 131/89 | HR 96 | Ht 74.0 in | Wt 263.0 lb

## 2015-07-29 DIAGNOSIS — R972 Elevated prostate specific antigen [PSA]: Secondary | ICD-10-CM | POA: Diagnosis not present

## 2015-07-29 MED ORDER — GENTAMICIN SULFATE 40 MG/ML IJ SOLN
80.0000 mg | Freq: Once | INTRAMUSCULAR | Status: AC
  Administered 2015-07-29: 80 mg via INTRAMUSCULAR

## 2015-07-29 MED ORDER — LEVOFLOXACIN 500 MG PO TABS
500.0000 mg | ORAL_TABLET | Freq: Once | ORAL | Status: AC
  Administered 2015-07-29: 500 mg via ORAL

## 2015-07-29 NOTE — Progress Notes (Signed)
   07/29/2015  CC: Elevated PSA  HPI: 64 year old male well-known to our clinic noted to have a rising PSA.  His PSA has risen from 4.4-5.4 and most recently 6.6 on 07/14/2015. As such, he was counseled to undergo prostate biopsy for which she presents today. Risks and benefits were previously discussed in detail.  See previous note for additional GU history.  Prostate Biopsy Procedure   Lab Results  Component Value Date   PSA 6.6* 07/08/2015   PSA 5.4* 06/15/2015   PSA 4.4* 06/04/2015   Prostate Biopsy Procedure note:  Informed consent was obtained after discussing risks/benefits of the procedure.  A time out was performed to ensure correct patient identity.  Pre-Procedure: - Gentamicin given prophylactically - Levaquin 500 mg administered PO -Transrectal Ultrasound performed revealing a 93 gm prostate -Small single hypoechoic lesion just right of midline, biopsy with first specimen -slightly intravesical mass effect into bladder but no well circumscribed median lobe  Procedure: - Prostate block performed using 10 cc 1% lidocaine and biopsies taken from sextant areas, a total of 12 under ultrasound guidance.  Post-Procedure: - Patient tolerated the procedure well - He was counseled to seek immediate medical attention if experiences any severe pain, significant bleeding, or fevers - Return in one week to discuss biopsy results

## 2015-08-05 LAB — PATHOLOGY REPORT

## 2015-08-06 ENCOUNTER — Ambulatory Visit: Payer: BLUE CROSS/BLUE SHIELD | Admitting: Urology

## 2015-08-06 ENCOUNTER — Telehealth: Payer: Self-pay | Admitting: Urology

## 2015-08-06 NOTE — Telephone Encounter (Signed)
Pathology from prostate biopsy reviewed. Evidence of an chronic inflammation. No evidence of malignancy. Patient made aware. Plan for follow-up in 6 months with KUB, PSA, DRE.  Vanna ScotlandAshley Dahna Hattabaugh, MD

## 2015-08-10 ENCOUNTER — Encounter: Payer: Self-pay | Admitting: Urology

## 2015-09-13 ENCOUNTER — Other Ambulatory Visit: Payer: Self-pay | Admitting: *Deleted

## 2015-09-13 MED ORDER — LOSARTAN POTASSIUM 50 MG PO TABS: 50.0000 mg | ORAL_TABLET | Freq: Every day | ORAL | Status: DC

## 2015-09-13 NOTE — Telephone Encounter (Signed)
Requesting 90 day supply.

## 2015-11-04 ENCOUNTER — Encounter: Payer: Self-pay | Admitting: Emergency Medicine

## 2015-11-04 ENCOUNTER — Emergency Department
Admission: EM | Admit: 2015-11-04 | Discharge: 2015-11-04 | Disposition: A | Discharge status: Home / Self Care | Payer: BLUE CROSS/BLUE SHIELD | Attending: Emergency Medicine | Admitting: Emergency Medicine

## 2015-11-04 ENCOUNTER — Emergency Department: Payer: BLUE CROSS/BLUE SHIELD

## 2015-11-04 DIAGNOSIS — E785 Hyperlipidemia, unspecified: Secondary | ICD-10-CM | POA: Diagnosis not present

## 2015-11-04 DIAGNOSIS — I129 Hypertensive chronic kidney disease with stage 1 through stage 4 chronic kidney disease, or unspecified chronic kidney disease: Secondary | ICD-10-CM | POA: Diagnosis not present

## 2015-11-04 DIAGNOSIS — N401 Enlarged prostate with lower urinary tract symptoms: Secondary | ICD-10-CM | POA: Insufficient documentation

## 2015-11-04 DIAGNOSIS — J189 Pneumonia, unspecified organism: Secondary | ICD-10-CM | POA: Diagnosis not present

## 2015-11-04 DIAGNOSIS — E039 Hypothyroidism, unspecified: Secondary | ICD-10-CM | POA: Insufficient documentation

## 2015-11-04 DIAGNOSIS — Z7982 Long term (current) use of aspirin: Secondary | ICD-10-CM | POA: Diagnosis not present

## 2015-11-04 DIAGNOSIS — E669 Obesity, unspecified: Secondary | ICD-10-CM | POA: Diagnosis not present

## 2015-11-04 DIAGNOSIS — N189 Chronic kidney disease, unspecified: Secondary | ICD-10-CM | POA: Diagnosis not present

## 2015-11-04 DIAGNOSIS — Z79899 Other long term (current) drug therapy: Secondary | ICD-10-CM | POA: Diagnosis not present

## 2015-11-04 DIAGNOSIS — R509 Fever, unspecified: Secondary | ICD-10-CM | POA: Diagnosis present

## 2015-11-04 LAB — URINALYSIS COMPLETE WITH MICROSCOPIC (ARMC ONLY)
BILIRUBIN URINE: NEGATIVE
Bacteria, UA: NONE SEEN
GLUCOSE, UA: NEGATIVE mg/dL
Ketones, ur: NEGATIVE mg/dL
LEUKOCYTES UA: NEGATIVE
NITRITE: NEGATIVE
PH: 6 (ref 5.0–8.0)
Protein, ur: NEGATIVE mg/dL
SPECIFIC GRAVITY, URINE: 1.017 (ref 1.005–1.030)
Squamous Epithelial / LPF: NONE SEEN

## 2015-11-04 LAB — CBC WITH DIFFERENTIAL/PLATELET
BASOS ABS: 0 10*3/uL (ref 0–0.1)
Basophils Relative: 0 %
EOS PCT: 0 %
Eosinophils Absolute: 0 10*3/uL (ref 0–0.7)
HCT: 42.4 % (ref 40.0–52.0)
Hemoglobin: 14.3 g/dL (ref 13.0–18.0)
LYMPHS ABS: 0.4 10*3/uL — AB (ref 1.0–3.6)
LYMPHS PCT: 4 %
MCH: 30.3 pg (ref 26.0–34.0)
MCHC: 33.7 g/dL (ref 32.0–36.0)
MCV: 90.1 fL (ref 80.0–100.0)
MONO ABS: 0.4 10*3/uL (ref 0.2–1.0)
MONOS PCT: 5 %
Neutro Abs: 7.6 10*3/uL — ABNORMAL HIGH (ref 1.4–6.5)
Neutrophils Relative %: 91 %
Platelets: 185 10*3/uL (ref 150–440)
RBC: 4.71 MIL/uL (ref 4.40–5.90)
RDW: 14.6 % — ABNORMAL HIGH (ref 11.5–14.5)
WBC: 8.4 10*3/uL (ref 3.8–10.6)

## 2015-11-04 LAB — BASIC METABOLIC PANEL
Anion gap: 9 (ref 5–15)
BUN: 21 mg/dL — AB (ref 6–20)
CHLORIDE: 106 mmol/L (ref 101–111)
CO2: 23 mmol/L (ref 22–32)
CREATININE: 1.06 mg/dL (ref 0.61–1.24)
Calcium: 9.1 mg/dL (ref 8.9–10.3)
GFR calc non Af Amer: >60 mL/min (ref >60)
Glucose, Bld: 142 mg/dL — ABNORMAL HIGH (ref 65–99)
POTASSIUM: 3.9 mmol/L (ref 3.5–5.1)
SODIUM: 138 mmol/L (ref 135–145)

## 2015-11-04 LAB — LACTIC ACID, PLASMA
LACTIC ACID, VENOUS: 2.2 mmol/L — AB (ref 0.5–2.0)
LACTIC ACID, VENOUS: 0.8 mmol/L (ref 0.5–2.0)

## 2015-11-04 LAB — RAPID INFLUENZA A&B ANTIGENS
Influenza A (ARMC): NEGATIVE
Influenza B (ARMC): NEGATIVE

## 2015-11-04 MED ORDER — SODIUM CHLORIDE 0.9 % IV BOLUS (SEPSIS)
1000.0000 mL | Freq: Once | INTRAVENOUS | Status: AC
  Administered 2015-11-04: 1000 mL via INTRAVENOUS

## 2015-11-04 MED ORDER — LEVOFLOXACIN IN D5W 750 MG/150ML IV SOLN
750.0000 mg | Freq: Once | INTRAVENOUS | Status: AC
Start: 2015-11-04 — End: 2015-11-04
  Administered 2015-11-04: 750 mg via INTRAVENOUS
  Filled 2015-11-04 (×2): qty 150

## 2015-11-04 MED ORDER — SODIUM CHLORIDE 0.9 % IV BOLUS (SEPSIS)
1000.0000 mL | Freq: Once | INTRAVENOUS | Status: AC
Start: 2015-11-04 — End: 2015-11-04
  Administered 2015-11-04: 1000 mL via INTRAVENOUS

## 2015-11-04 MED ORDER — ACETAMINOPHEN 500 MG PO TABS
1000.0000 mg | ORAL_TABLET | Freq: Once | ORAL | Status: AC
  Administered 2015-11-04: 1000 mg via ORAL
  Filled 2015-11-04: qty 2

## 2015-11-04 MED ORDER — LEVOFLOXACIN 750 MG PO TABS: 750.0000 mg | ORAL_TABLET | Freq: Every day | ORAL | Status: AC

## 2015-11-04 NOTE — ED Notes (Signed)
Pt arrived via EMS from home for complaints of fever, nausea and vomiting for two days. EMS reports 100.8 oral temperature, 140/86, HR 120-130 sinus tachycardia, CBG 114. EMS administered Zofran 4 mg IV.

## 2015-11-04 NOTE — ED Notes (Signed)
Pt returned from CT °

## 2015-11-04 NOTE — ED Notes (Signed)
Pt states has recently been exposed to mono.

## 2015-11-04 NOTE — ED Notes (Signed)
Abnormal Result -  Lactic Acid, Venous: 2.2 Reported to Dr. Derrill KayGoodman at this time.

## 2015-11-04 NOTE — ED Notes (Signed)
Called Pharmacy to send Levaquin. (out of stock in ED) Call #[phone]

## 2015-11-04 NOTE — ED Provider Notes (Signed)
[facility] Emergency Department Provider Note   ____________________________________________  Time seen: ~1920  I have reviewed the triage vital signs and the nursing notes.   HISTORY  Chief Complaint Fever and Emesis   History limited by: Not Limited   HPI [name] [name] is a 65 y.o. male who presents to the emergency department today with concerns for fever, feeling unwell. He states this is been going on for roughly 1 week. The patient states that he did have some chills. He had fever today. He has felt weak. He has had some associated nausea. He did vomit once today and once a couple of days ago. He states it was projectile. He has had some cough. He has had some nasal congestion. States he has been exposed to mono.     Past Medical History  Diagnosis Date  . Sleep apnea   . Dysrhythmia   . Hypertension   . GERD (gastroesophageal reflux disease)   . Arthritis   . HOH (hard of hearing)   . Chronic kidney disease     stones  . Prostate disease   . Hypothyroidism     Patient Active Problem List   Diagnosis Date Noted  . Encounter for general adult medical examination without abnormal findings 07/02/2015  . Right flank pain 07/02/2015  . Hyperlipidemia 04/13/2015  . Hypersomnia 04/13/2015  . Kidney stones 04/13/2015  . Elevated transaminase level 04/13/2015  . History of nephrolithiasis 04/13/2015  . Obstructive sleep apnea 04/13/2015  . Obesity 04/13/2015  . Hypothyroidism 03/03/2015  . Renal colic 04/28/2014  . Benign prostatic hyperplasia with urinary obstruction 07/17/2012  . Chronic prostatitis 07/17/2012  . Calculus of kidney 07/17/2012  . Decreased libido 07/17/2012  . Elevated prostate specific antigen (PSA) 07/17/2012  . 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 Neuralgia neuritis, sciatic nerve 07/17/2012  . Family history of endocrine and metabolic disease 40/98/1191  . Attention deficit disorder without  hyperactivity 04/16/2009  . Essential hypertension 02/16/2006    Past Surgical History  Procedure Laterality Date  . Arm skin lesion biopsy / excision    . Kidney stone surgery    . Colonoscopy    . Incision and drainage      anal infection  . Cataract extraction w/phaco Right 02/23/2015    Procedure: CATARACT EXTRACTION PHACO AND INTRAOCULAR LENS PLACEMENT (IOC);  Surgeon: Galen Manila, MD;  Location: [facility] ORS;  Service: Ophthalmology;  Laterality: Right;  [place] 01:01 AP%23.5 CDE 14.49 fluid pack lot #[id number]  . Cataract extraction w/phaco Left 03/30/2015    Procedure: CATARACT EXTRACTION PHACO AND INTRAOCULAR LENS PLACEMENT (IOC);  Surgeon: Galen Manila, MD;  Location: [facility] ORS;  Service: Ophthalmology;  Laterality: Left;  [place]: 01:08.3 AP%: 22.1 CDE: 15.12  Fluid Lot # [id number]  . Right ureterolithiasis with stent placement  04/29/2014    Dr. Sheppard Penton    Current Outpatient Rx  Name  Route  Sig  Dispense  Refill  . aspirin 81 MG tablet   Oral   Take 81 mg by mouth daily.         Marland Kitchen docusate sodium (COLACE) 100 MG capsule   Oral   Take 100 mg by mouth daily.          Marland Kitchen levothyroxine (SYNTHROID, LEVOTHROID) 50 MCG tablet      TAKE 1 TABLET BY MOUTH EVERY DAY   30 tablet   12   . losartan (COZAAR) 50 MG tablet   Oral   Take 1 tablet (  50 mg total) by mouth daily.   90 tablet   4   . Multiple Vitamins-Minerals (MULTIVITAMIN ADULT PO)   Oral   Take 1 tablet by mouth daily.         . ranitidine (ZANTAC) 150 MG tablet   Oral   Take 150 mg by mouth daily.         . tadalafil (CIALIS) 5 MG tablet   Oral   Take 1 tablet (5 mg total) by mouth daily.   30 tablet   11     Allergies Crestor ; Ibuprofen; Penicillins; and Tylenol  Family History  Problem Relation Age of Onset  . Pancreatic cancer Mother   . Neuropathy Father   . Throat cancer Father   . Lung cancer Father     Social History Social History  Substance Use Topics  . Smoking status:  Never Smoker   . Smokeless tobacco: None  . Alcohol Use: 0.0 oz/week    0 Standard drinks or equivalent per week     Comment: moderate use; 8-9 drinks a week    Review of Systems  Constitutional: Positive for fever. Cardiovascular: Positive for right lower chest pain Respiratory: Negative for shortness of breath. Gastrointestinal: Negative for abdominal pain, vomiting and diarrhea. Neurological: Negative for headaches, focal weakness or numbness.  10-point ROS otherwise negative.  ____________________________________________   PHYSICAL EXAM:  VITAL SIGNS: ED Triage Vitals  Enc Vitals Group     BP 11/04/15 1853 119/78 mmHg     Pulse Rate 11/04/15 1853 135     Resp 11/04/15 1853 20     Temp 11/04/15 1853 102.4 F (39.1 C)     Temp Source 11/04/15 1853 Oral     SpO2 11/04/15 1853 92 %     Weight 11/04/15 1853 260 lb (117.935 kg)     Height 11/04/15 1853  (1.88 m)   Constitutional: Alert and oriented. Well appearing and in no distress. Eyes: Conjunctivae are normal. PERRL. Normal extraocular movements. ENT   Head: Normocephalic and atraumatic.   Nose: No congestion/rhinnorhea.   Mouth/Throat: Mucous membranes are moist.   Neck: No stridor. Hematological/Lymphatic/Immunilogical: No cervical lymphadenopathy. Cardiovascular: Tachycardic, regular rhythm.  No murmurs, rubs, or gallops. Respiratory: Normal respiratory effort without tachypnea nor retractions. Breath sounds are clear and equal bilaterally. No wheezes/rales/rhonchi. Gastrointestinal: Soft and nontender. No distention. There is no CVA tenderness. Genitourinary: Deferred Musculoskeletal: Normal range of motion in all extremities. No joint effusions.  No lower extremity tenderness nor edema. Neurologic:  Normal speech and language. No gross focal neurologic deficits are appreciated.  Skin:  Skin is warm, dry and intact. No rash noted. Psychiatric: Mood and affect are normal. Speech and behavior  are normal. Patient exhibits appropriate insight and judgment.  ____________________________________________    LABS (pertinent positives/negatives)  Labs Reviewed  CBC WITH DIFFERENTIAL/PLATELET - Abnormal; Notable for the following:    RDW 14.6 (*)    Neutro Abs 7.6 (*)    Lymphs Abs 0.4 (*)    All other components within normal limits  BASIC METABOLIC PANEL - Abnormal; Notable for the following:    Glucose, Bld 142 (*)    BUN 21 (*)    All other components within normal limits  LACTIC ACID, PLASMA - Abnormal; Notable for the following:    Lactic Acid, Venous 2.2 (*)    All other components within normal limits  URINALYSIS COMPLETEWITH MICROSCOPIC ([facility] ONLY) - Abnormal; Notable for the following:    Color, Urine AMBER (*)  APPearance CLEAR (*)    Hgb urine dipstick 1+ (*)    All other components within normal limits  RAPID INFLUENZA A&B ANTIGENS ([facility] ONLY)  LACTIC ACID, PLASMA     ____________________________________________   EKG  None  ____________________________________________    RADIOLOGY  CXR IMPRESSION: Anterior inferior right middle lobe infiltrate concerning for pneumonia.   ____________________________________________   PROCEDURES  Procedure(s) performed: None  Critical Care performed: No  ____________________________________________   INITIAL IMPRESSION / ASSESSMENT AND PLAN / ED COURSE  Pertinent labs & imaging results that were available during my care of the patient were reviewed by me and considered in my medical decision making (see chart for details).  Patient presented to the emergency department today because of concerns for fever, nausea and vomiting. Chest x-ray was concerning for pneumonia. Patient initially had an elevated lactic acidosis and was quite tachycardic. The lactic acid normalize after IV fluids and his pulse did improve. The patient continued to appear well throughout his course in the emergency department. I  did offer discharge with antibiotics versus admission. Patient felt comfortable and desired discharge. I think this is reasonable given normalization of lactic acid. Will discharge home with antibiotics. Encourage primary care follow-up.  ____________________________________________   FINAL CLINICAL IMPRESSION(S) / ED DIAGNOSES  Final diagnoses:  Community acquired pneumonia     Phineas SemenGraydon Markell Sciascia, MD 11/04/15 [phone]

## 2015-11-04 NOTE — Discharge Instructions (Signed)
Please seek medical attention for any high fevers, chest pain, shortness of breath, change in behavior, persistent vomiting, bloody stool or any other new or concerning symptoms. ° °Community-Acquired Pneumonia, Adult °Pneumonia is an infection of the lungs. There are different types of pneumonia. One type can develop while a person is in a hospital. A different type, called community-acquired pneumonia, develops in people who are not, or have not recently been, in the hospital or other health care facility.  °CAUSES °Pneumonia may be caused by bacteria, viruses, or funguses. Community-acquired pneumonia is often caused by Streptococcus pneumonia bacteria. These bacteria are often passed from one person to another by breathing in droplets from the cough or sneeze of an infected person. °RISK FACTORS °The condition is more likely to develop in: °· People who have chronic diseases, such as chronic obstructive pulmonary disease (COPD), asthma, congestive heart failure, cystic fibrosis, diabetes, or kidney disease. °· People who have early-stage or late-stage HIV. °· People who have sickle cell disease. °· People who have had their spleen removed (splenectomy). °· People who have poor dental hygiene. °· People who have medical conditions that increase the risk of breathing in (aspirating) secretions their own mouth and nose.   °· People who have a weakened immune system (immunocompromised). °· People who smoke. °· People who travel to areas where pneumonia-causing germs commonly exist. °· People who are around animal habitats or animals that have pneumonia-causing germs, including birds, bats, rabbits, cats, and farm animals. °SYMPTOMS °Symptoms of this condition include: °· A dry cough. °· A wet (productive) cough. °· Fever. °· Sweating. °· Chest pain, especially when breathing deeply or coughing. °· Rapid breathing or difficulty breathing. °· Shortness of breath. °· Shaking chills. °· Fatigue. °· Muscle  aches. °DIAGNOSIS °Your health care provider will take a medical history and perform a physical exam. You may also have other tests, including: °· Imaging studies of your chest, including X-rays. °· Tests to check your blood oxygen level and other blood gases. °· Other tests on blood, mucus (sputum), fluid around your lungs (pleural fluid), and urine. °If your pneumonia is severe, other tests may be done to identify the specific cause of your illness. °TREATMENT °The type of treatment that you receive depends on many factors, such as the cause of your pneumonia, the medicines you take, and other medical conditions that you have. For most adults, treatment and recovery from pneumonia may occur at home. In some cases, treatment must happen in a hospital. Treatment may include: °· Antibiotic medicines, if the pneumonia was caused by bacteria. °· Antiviral medicines, if the pneumonia was caused by a virus. °· Medicines that are given by mouth or through an IV tube. °· Oxygen. °· Respiratory therapy. °Although rare, treating severe pneumonia may include: °· Mechanical ventilation. This is done if you are not breathing well on your own and you cannot maintain a safe blood oxygen level. °· Thoracentesis. This procedure removes fluid around one lung or both lungs to help you breathe better. °HOME CARE INSTRUCTIONS °· Take over-the-counter and prescription medicines only as told by your health care provider. °¨ Only take cough medicine if you are losing sleep. Understand that cough medicine can prevent your body's natural ability to remove mucus from your lungs. °¨ If you were prescribed an antibiotic medicine, take it as told by your health care provider. Do not stop taking the antibiotic even if you start to feel better. °· Sleep in a semi-upright position at night. Try sleeping in a reclining chair, or place a few pillows under your head. °· Do   not use tobacco products, including cigarettes, chewing tobacco, and  e-cigarettes. If you need help quitting, ask your health care provider. °· Drink enough water to keep your urine clear or pale yellow. This will help to thin out mucus secretions in your lungs. °PREVENTION °There are ways that you can decrease your risk of developing community-acquired pneumonia. Consider getting a pneumococcal vaccine if: °· You are older than 65 years of age. °· You are older than 64 years of age and are undergoing cancer treatment, have chronic lung disease, or have other medical conditions that affect your immune system. Ask your health care provider if this applies to you. °There are different types and schedules of pneumococcal vaccines. Ask your health care provider which vaccination option is best for you. °You may also prevent community-acquired pneumonia if you take these actions: °· Get an influenza vaccine every year. Ask your health care provider which type of influenza vaccine is best for you. °· Go to the dentist on a regular basis. °· Wash your hands often. Use hand sanitizer if soap and water are not available. °SEEK MEDICAL CARE IF: °· You have a fever. °· You are losing sleep because you cannot control your cough with cough medicine. °SEEK IMMEDIATE MEDICAL CARE IF: °· You have worsening shortness of breath. °· You have increased chest pain. °· Your sickness becomes worse, especially if you are an older adult or have a weakened immune system. °· You cough up blood. °  °This information is not intended to replace advice given to you by your health care provider. Make sure you discuss any questions you have with your health care provider. °  °Document Released: 08/14/2005 Document Revised: 05/05/2015 Document Reviewed: 12/09/2014 °Elsevier Interactive Patient Education ©2016 [employer] ° °

## 2015-11-05 ENCOUNTER — Telehealth: Payer: Self-pay | Admitting: Family Medicine

## 2015-11-05 NOTE — Telephone Encounter (Signed)
Pt is scheduled for ER f/u on 11/09/15 @ 3. Pt's wife stated pt was discharged at midnight 11/05/15 and was treated for bacterial pneumonia. Thanks TNP

## 2015-11-09 ENCOUNTER — Encounter: Payer: Self-pay | Admitting: Family Medicine

## 2015-11-09 ENCOUNTER — Ambulatory Visit (INDEPENDENT_AMBULATORY_CARE_PROVIDER_SITE_OTHER): Payer: BLUE CROSS/BLUE SHIELD | Admitting: Family Medicine

## 2015-11-09 VITALS — BP 128/80 | HR 108 | Temp 98.6°F | Resp 16 | Wt 270.0 lb

## 2015-11-09 DIAGNOSIS — J189 Pneumonia, unspecified organism: Secondary | ICD-10-CM

## 2015-11-09 NOTE — Progress Notes (Signed)
Patient: [name] EatonDonald Belmontes Male    DOB: 11/04/1950   65 y.o.   MRN: [id number] Visit Date: 11/09/2015  Today's Provider: Mila Merryonald Kieffer Blatz, MD   Chief Complaint  Patient presents with  . Hospitalization Follow-up  . Pneumonia   Subjective:    HPI   Follow up ER visit  Patient was seen in ER for fever, feeling unwell on 11/04/2015. He was treated for Community acquired pneumonia. Treatment for this included Chest x-ray showed anterior inferior RML infilatrate was concerning for pneumonia and discharge home with antibiotics, levofloxacin 750 mg qdx7days. He reports good compliance with treatment. He reports this condition is Improved. Cough has been mild throughout illness.  ----------------------------------------------------------------------    Allergies  Allergen Reactions  . Crestor  [Rosuvastatin Calcium]     Other reaction(s): Muscle Pain (severe)  . Ibuprofen   . Penicillins    Previous Medications   ASPIRIN 81 MG TABLET    Take 81 mg by mouth daily.   DOCUSATE SODIUM (COLACE) 100 MG CAPSULE    Take 100 mg by mouth daily.    LEVOFLOXACIN (LEVAQUIN) 750 MG TABLET    Take 1 tablet (750 mg total) by mouth daily.   LEVOTHYROXINE (SYNTHROID, LEVOTHROID) 50 MCG TABLET    TAKE 1 TABLET BY MOUTH EVERY DAY   LOSARTAN (COZAAR) 50 MG TABLET    Take 1 tablet (50 mg total) by mouth daily.   MULTIPLE VITAMINS-MINERALS (MULTIVITAMIN ADULT PO)    Take 1 tablet by mouth daily.   RANITIDINE (ZANTAC) 150 MG TABLET    Take 150 mg by mouth daily.   TADALAFIL (CIALIS) 5 MG TABLET    Take 1 tablet (5 mg total) by mouth daily.    Review of Systems  Constitutional: Positive for fatigue. Negative for fever, chills and appetite change.  Respiratory: Positive for shortness of breath. Negative for chest tightness and wheezing.   Cardiovascular: Negative for chest pain and palpitations.  Gastrointestinal: Negative for nausea, vomiting and abdominal pain.    Social History  Substance Use  Topics  . Smoking status: Never Smoker   . Smokeless tobacco: Not on file  . Alcohol Use: 0.0 oz/week    0 Standard drinks or equivalent per week     Comment: moderate use; 8-9 drinks a week   Objective:   BP 128/80 mmHg  Pulse 108  Temp(Src) 98.6 F (37 C) (Oral)  Resp 16  Wt 270 lb (122.471 kg)  SpO2 96%  Physical Exam   General Appearance:    Alert, cooperative, no distress  Eyes:    PERRL, conjunctiva/corneas clear, EOM's intact       Lungs:     Clear to auscultation bilaterally, respirations unlabored  Heart:    Regular rate and rhythm  Neurologic:   Awake, alert, oriented x 3. No apparent focal neurological           defect.          Assessment & Plan:     1. Community acquired pneumonia Clinically resolved. He is to finish levaquin and repeat chest Xray next week - DG Chest 2 View; Future  He state he has been feeling fatigued for a few months. His glucose was noted to be elevated at hospital. He is using CPAP every day. He is due or CPE in April and will return then for further evaluation and labs.       Mila Merryonald Kalyb Pemble, MD  [facility]

## 2015-11-09 NOTE — Patient Instructions (Signed)
Go to [facility] on [address] for Chest Xray on Wednesday 11/17/2015.

## 2015-11-18 ENCOUNTER — Ambulatory Visit
Admission: RE | Admit: 2015-11-18 | Discharge: 2015-11-18 | Disposition: A | Discharge status: Home / Self Care | Payer: BLUE CROSS/BLUE SHIELD | Source: Ambulatory Visit | Attending: Family Medicine | Admitting: Family Medicine

## 2015-11-18 DIAGNOSIS — J189 Pneumonia, unspecified organism: Secondary | ICD-10-CM

## 2015-11-20 ENCOUNTER — Encounter: Payer: Self-pay | Admitting: Emergency Medicine

## 2015-11-20 ENCOUNTER — Inpatient Hospital Stay
Admission: EM | Admit: 2015-11-20 | Discharge: 2015-11-24 | DRG: 417 | Disposition: A | Discharge status: Home / Self Care | Payer: BLUE CROSS/BLUE SHIELD | Attending: Surgery | Admitting: Surgery

## 2015-11-20 DIAGNOSIS — Z87442 Personal history of urinary calculi: Secondary | ICD-10-CM

## 2015-11-20 DIAGNOSIS — E039 Hypothyroidism, unspecified: Secondary | ICD-10-CM | POA: Diagnosis present

## 2015-11-20 DIAGNOSIS — Z7982 Long term (current) use of aspirin: Secondary | ICD-10-CM

## 2015-11-20 DIAGNOSIS — Z961 Presence of intraocular lens: Secondary | ICD-10-CM | POA: Diagnosis present

## 2015-11-20 DIAGNOSIS — Z880 Allergy status to penicillin: Secondary | ICD-10-CM

## 2015-11-20 DIAGNOSIS — N189 Chronic kidney disease, unspecified: Secondary | ICD-10-CM | POA: Diagnosis present

## 2015-11-20 DIAGNOSIS — M1990 Unspecified osteoarthritis, unspecified site: Secondary | ICD-10-CM | POA: Diagnosis present

## 2015-11-20 DIAGNOSIS — R0902 Hypoxemia: Secondary | ICD-10-CM

## 2015-11-20 DIAGNOSIS — K859 Acute pancreatitis without necrosis or infection, unspecified: Secondary | ICD-10-CM

## 2015-11-20 DIAGNOSIS — Z9841 Cataract extraction status, right eye: Secondary | ICD-10-CM

## 2015-11-20 DIAGNOSIS — H9190 Unspecified hearing loss, unspecified ear: Secondary | ICD-10-CM | POA: Diagnosis present

## 2015-11-20 DIAGNOSIS — I129 Hypertensive chronic kidney disease with stage 1 through stage 4 chronic kidney disease, or unspecified chronic kidney disease: Secondary | ICD-10-CM | POA: Diagnosis present

## 2015-11-20 DIAGNOSIS — Z801 Family history of malignant neoplasm of trachea, bronchus and lung: Secondary | ICD-10-CM

## 2015-11-20 DIAGNOSIS — K219 Gastro-esophageal reflux disease without esophagitis: Secondary | ICD-10-CM | POA: Diagnosis present

## 2015-11-20 DIAGNOSIS — Z808 Family history of malignant neoplasm of other organs or systems: Secondary | ICD-10-CM

## 2015-11-20 DIAGNOSIS — K668 Other specified disorders of peritoneum: Secondary | ICD-10-CM | POA: Diagnosis present

## 2015-11-20 DIAGNOSIS — R1011 Right upper quadrant pain: Secondary | ICD-10-CM

## 2015-11-20 DIAGNOSIS — K8067 Calculus of gallbladder and bile duct with acute and chronic cholecystitis with obstruction: Principal | ICD-10-CM | POA: Diagnosis present

## 2015-11-20 DIAGNOSIS — K851 Biliary acute pancreatitis without necrosis or infection: Secondary | ICD-10-CM | POA: Diagnosis present

## 2015-11-20 DIAGNOSIS — Z9842 Cataract extraction status, left eye: Secondary | ICD-10-CM

## 2015-11-20 DIAGNOSIS — K8050 Calculus of bile duct without cholangitis or cholecystitis without obstruction: Secondary | ICD-10-CM | POA: Diagnosis present

## 2015-11-20 DIAGNOSIS — G4730 Sleep apnea, unspecified: Secondary | ICD-10-CM | POA: Diagnosis present

## 2015-11-20 DIAGNOSIS — Z9889 Other specified postprocedural states: Secondary | ICD-10-CM

## 2015-11-20 DIAGNOSIS — Z800 Family history of malignant neoplasm of digestive organs: Secondary | ICD-10-CM

## 2015-11-20 DIAGNOSIS — Z79899 Other long term (current) drug therapy: Secondary | ICD-10-CM

## 2015-11-20 LAB — CBC
HCT: 43.2 % (ref 40.0–52.0)
HEMOGLOBIN: 14.6 g/dL (ref 13.0–18.0)
MCH: 29.8 pg (ref 26.0–34.0)
MCHC: 33.7 g/dL (ref 32.0–36.0)
MCV: 88.6 fL (ref 80.0–100.0)
PLATELETS: 214 10*3/uL (ref 150–440)
RBC: 4.88 MIL/uL (ref 4.40–5.90)
RDW: 14.2 % (ref 11.5–14.5)
WBC: 12.9 10*3/uL — ABNORMAL HIGH (ref 3.8–10.6)

## 2015-11-20 MED ORDER — MORPHINE SULFATE (PF) 4 MG/ML IV SOLN
4.0000 mg | Freq: Once | INTRAVENOUS | Status: AC
Start: 2015-11-21 — End: 2015-11-20
  Administered 2015-11-20: 4 mg via INTRAVENOUS

## 2015-11-20 MED ORDER — ONDANSETRON HCL 4 MG/2ML IJ SOLN
4.0000 mg | Freq: Once | INTRAMUSCULAR | Status: AC | PRN
  Administered 2015-11-20: 4 mg via INTRAVENOUS
  Filled 2015-11-20: qty 2

## 2015-11-20 MED ORDER — MORPHINE SULFATE (PF) 4 MG/ML IV SOLN
INTRAVENOUS | Status: AC
  Administered 2015-11-20: 4 mg via INTRAVENOUS
  Filled 2015-11-20: qty 1

## 2015-11-20 NOTE — ED Notes (Signed)
Patient with intermittent right side abd pain that started today. Patient with nausea and vomiting. Patient reports fever at home of 101.

## 2015-11-21 ENCOUNTER — Emergency Department: Payer: BLUE CROSS/BLUE SHIELD

## 2015-11-21 DIAGNOSIS — K851 Biliary acute pancreatitis without necrosis or infection: Secondary | ICD-10-CM | POA: Diagnosis present

## 2015-11-21 DIAGNOSIS — H9190 Unspecified hearing loss, unspecified ear: Secondary | ICD-10-CM | POA: Diagnosis present

## 2015-11-21 DIAGNOSIS — K8050 Calculus of bile duct without cholangitis or cholecystitis without obstruction: Secondary | ICD-10-CM | POA: Diagnosis not present

## 2015-11-21 DIAGNOSIS — Z801 Family history of malignant neoplasm of trachea, bronchus and lung: Secondary | ICD-10-CM | POA: Diagnosis not present

## 2015-11-21 DIAGNOSIS — K859 Acute pancreatitis without necrosis or infection, unspecified: Secondary | ICD-10-CM | POA: Diagnosis not present

## 2015-11-21 DIAGNOSIS — K8067 Calculus of gallbladder and bile duct with acute and chronic cholecystitis with obstruction: Secondary | ICD-10-CM | POA: Diagnosis present

## 2015-11-21 DIAGNOSIS — Z87442 Personal history of urinary calculi: Secondary | ICD-10-CM | POA: Diagnosis not present

## 2015-11-21 DIAGNOSIS — Z9842 Cataract extraction status, left eye: Secondary | ICD-10-CM | POA: Diagnosis not present

## 2015-11-21 DIAGNOSIS — K668 Other specified disorders of peritoneum: Secondary | ICD-10-CM | POA: Diagnosis present

## 2015-11-21 DIAGNOSIS — I129 Hypertensive chronic kidney disease with stage 1 through stage 4 chronic kidney disease, or unspecified chronic kidney disease: Secondary | ICD-10-CM | POA: Diagnosis present

## 2015-11-21 DIAGNOSIS — R1011 Right upper quadrant pain: Secondary | ICD-10-CM | POA: Diagnosis present

## 2015-11-21 DIAGNOSIS — Z79899 Other long term (current) drug therapy: Secondary | ICD-10-CM | POA: Diagnosis not present

## 2015-11-21 DIAGNOSIS — K219 Gastro-esophageal reflux disease without esophagitis: Secondary | ICD-10-CM | POA: Diagnosis present

## 2015-11-21 DIAGNOSIS — M1990 Unspecified osteoarthritis, unspecified site: Secondary | ICD-10-CM | POA: Diagnosis present

## 2015-11-21 DIAGNOSIS — Z9889 Other specified postprocedural states: Secondary | ICD-10-CM | POA: Diagnosis not present

## 2015-11-21 DIAGNOSIS — N189 Chronic kidney disease, unspecified: Secondary | ICD-10-CM | POA: Diagnosis present

## 2015-11-21 DIAGNOSIS — Z800 Family history of malignant neoplasm of digestive organs: Secondary | ICD-10-CM | POA: Diagnosis not present

## 2015-11-21 DIAGNOSIS — Z961 Presence of intraocular lens: Secondary | ICD-10-CM | POA: Diagnosis present

## 2015-11-21 DIAGNOSIS — Z9841 Cataract extraction status, right eye: Secondary | ICD-10-CM | POA: Diagnosis not present

## 2015-11-21 DIAGNOSIS — Z808 Family history of malignant neoplasm of other organs or systems: Secondary | ICD-10-CM | POA: Diagnosis not present

## 2015-11-21 DIAGNOSIS — E039 Hypothyroidism, unspecified: Secondary | ICD-10-CM | POA: Diagnosis present

## 2015-11-21 DIAGNOSIS — Z880 Allergy status to penicillin: Secondary | ICD-10-CM | POA: Diagnosis not present

## 2015-11-21 DIAGNOSIS — Z7982 Long term (current) use of aspirin: Secondary | ICD-10-CM | POA: Diagnosis not present

## 2015-11-21 DIAGNOSIS — G4730 Sleep apnea, unspecified: Secondary | ICD-10-CM | POA: Diagnosis present

## 2015-11-21 LAB — URINALYSIS COMPLETE WITH MICROSCOPIC (ARMC ONLY)
BACTERIA UA: NONE SEEN
Bilirubin Urine: NEGATIVE
Glucose, UA: NEGATIVE mg/dL
HGB URINE DIPSTICK: NEGATIVE
Ketones, ur: NEGATIVE mg/dL
LEUKOCYTES UA: NEGATIVE
Nitrite: NEGATIVE
PH: 6 (ref 5.0–8.0)
PROTEIN: NEGATIVE mg/dL
SQUAMOUS EPITHELIAL / LPF: NONE SEEN
Specific Gravity, Urine: 1.013 (ref 1.005–1.030)

## 2015-11-21 LAB — COMPREHENSIVE METABOLIC PANEL
ALBUMIN: 4.3 g/dL (ref 3.5–5.0)
ALK PHOS: 149 U/L — AB (ref 38–126)
ALT: 140 U/L — AB (ref 17–63)
ALT: 173 U/L — ABNORMAL HIGH (ref 17–63)
ANION GAP: 9 (ref 5–15)
AST: 117 U/L — AB (ref 15–41)
AST: 155 U/L — ABNORMAL HIGH (ref 15–41)
Albumin: 3.7 g/dL (ref 3.5–5.0)
Alkaline Phosphatase: 118 U/L (ref 38–126)
Anion gap: 6 (ref 5–15)
BILIRUBIN TOTAL: 5.1 mg/dL — AB (ref 0.3–1.2)
BUN: 23 mg/dL — AB (ref 6–20)
BUN: 24 mg/dL — ABNORMAL HIGH (ref 6–20)
CALCIUM: 9.3 mg/dL (ref 8.9–10.3)
CHLORIDE: 109 mmol/L (ref 101–111)
CO2: 25 mmol/L (ref 22–32)
CO2: 25 mmol/L (ref 22–32)
CREATININE: 1.19 mg/dL (ref 0.61–1.24)
Calcium: 8.7 mg/dL — ABNORMAL LOW (ref 8.9–10.3)
Chloride: 103 mmol/L (ref 101–111)
Creatinine, Ser: 1.2 mg/dL (ref 0.61–1.24)
GFR calc Af Amer: >60 mL/min (ref >60)
GFR calc Af Amer: >60 mL/min (ref >60)
GFR calc non Af Amer: >60 mL/min (ref >60)
GLUCOSE: 151 mg/dL — AB (ref 65–99)
Glucose, Bld: 156 mg/dL — ABNORMAL HIGH (ref 65–99)
Potassium: 3.6 mmol/L (ref 3.5–5.1)
Potassium: 3.9 mmol/L (ref 3.5–5.1)
SODIUM: 137 mmol/L (ref 135–145)
Sodium: 140 mmol/L (ref 135–145)
Total Bilirubin: 5.6 mg/dL — ABNORMAL HIGH (ref 0.3–1.2)
Total Protein: 6.6 g/dL (ref 6.5–8.1)
Total Protein: 7.3 g/dL (ref 6.5–8.1)

## 2015-11-21 LAB — APTT
APTT: 28 s (ref 24–36)
aPTT: 26 seconds (ref 24–36)

## 2015-11-21 LAB — LIPASE, BLOOD: Lipase: 7442 U/L — ABNORMAL HIGH (ref 11–51)

## 2015-11-21 LAB — CBC
HCT: 42.1 % (ref 40.0–52.0)
Hemoglobin: 14 g/dL (ref 13.0–18.0)
MCH: 30.2 pg (ref 26.0–34.0)
MCHC: 33.3 g/dL (ref 32.0–36.0)
MCV: 90.6 fL (ref 80.0–100.0)
PLATELETS: 214 10*3/uL (ref 150–440)
RBC: 4.65 MIL/uL (ref 4.40–5.90)
RDW: 14.5 % (ref 11.5–14.5)
WBC: 12.3 10*3/uL — AB (ref 3.8–10.6)

## 2015-11-21 LAB — PROTIME-INR
INR: 1.1
PROTHROMBIN TIME: 14.4 s (ref 11.4–15.0)

## 2015-11-21 LAB — BILIRUBIN, DIRECT: Bilirubin, Direct: 3.2 mg/dL — ABNORMAL HIGH (ref 0.1–0.5)

## 2015-11-21 MED ORDER — DIPHENHYDRAMINE HCL 12.5 MG/5ML PO ELIX: 12.5000 mg | ORAL_SOLUTION | Freq: Four times a day (QID) | ORAL | Status: DC | PRN

## 2015-11-21 MED ORDER — DEXTROSE-NACL 5-0.9 % IV SOLN
INTRAVENOUS | Status: DC
  Administered 2015-11-21 – 2015-11-24 (×9): via INTRAVENOUS

## 2015-11-21 MED ORDER — ONDANSETRON HCL 4 MG/2ML IJ SOLN
4.0000 mg | Freq: Four times a day (QID) | INTRAMUSCULAR | Status: DC | PRN
  Administered 2015-11-22 – 2015-11-23 (×2): 4 mg via INTRAVENOUS

## 2015-11-21 MED ORDER — SODIUM CHLORIDE 0.9 % IV BOLUS (SEPSIS)
1000.0000 mL | Freq: Once | INTRAVENOUS | Status: AC
  Administered 2015-11-21: 1000 mL via INTRAVENOUS

## 2015-11-21 MED ORDER — MORPHINE SULFATE (PF) 2 MG/ML IV SOLN
2.0000 mg | INTRAVENOUS | Status: DC | PRN
  Administered 2015-11-21: 2 mg via INTRAVENOUS
  Filled 2015-11-21: qty 1

## 2015-11-21 MED ORDER — CIPROFLOXACIN IN D5W 400 MG/200ML IV SOLN
400.0000 mg | Freq: Two times a day (BID) | INTRAVENOUS | Status: DC
  Administered 2015-11-22 – 2015-11-23 (×4): 400 mg via INTRAVENOUS
  Filled 2015-11-21 (×7): qty 200

## 2015-11-21 MED ORDER — ONDANSETRON 8 MG PO TBDP
4.0000 mg | ORAL_TABLET | Freq: Four times a day (QID) | ORAL | Status: DC | PRN
  Filled 2015-11-21: qty 1

## 2015-11-21 MED ORDER — LEVOFLOXACIN IN D5W 750 MG/150ML IV SOLN
750.0000 mg | Freq: Once | INTRAVENOUS | Status: AC
  Administered 2015-11-21: 750 mg via INTRAVENOUS
  Filled 2015-11-21: qty 150

## 2015-11-21 MED ORDER — METRONIDAZOLE IN NACL 5-0.79 MG/ML-% IV SOLN
500.0000 mg | Freq: Three times a day (TID) | INTRAVENOUS | Status: DC
  Administered 2015-11-21 – 2015-11-24 (×9): 500 mg via INTRAVENOUS
  Filled 2015-11-21 (×11): qty 100

## 2015-11-21 MED ORDER — DIPHENHYDRAMINE HCL 50 MG/ML IJ SOLN
12.5000 mg | Freq: Four times a day (QID) | INTRAMUSCULAR | Status: DC | PRN
  Administered 2015-11-23: 12.5 mg via INTRAVENOUS
  Filled 2015-11-21: qty 1

## 2015-11-21 MED ORDER — METRONIDAZOLE IN NACL 5-0.79 MG/ML-% IV SOLN
500.0000 mg | Freq: Once | INTRAVENOUS | Status: AC
  Administered 2015-11-21: 500 mg via INTRAVENOUS
  Filled 2015-11-21: qty 100

## 2015-11-21 MED ORDER — CETYLPYRIDINIUM CHLORIDE 0.05 % MT LIQD
7.0000 mL | Freq: Two times a day (BID) | OROMUCOSAL | Status: DC
  Administered 2015-11-21 – 2015-11-24 (×5): 7 mL via OROMUCOSAL

## 2015-11-21 MED ORDER — PANTOPRAZOLE SODIUM 40 MG IV SOLR
40.0000 mg | Freq: Every day | INTRAVENOUS | Status: DC
  Administered 2015-11-21 – 2015-11-23 (×3): 40 mg via INTRAVENOUS
  Filled 2015-11-21 (×3): qty 40

## 2015-11-21 NOTE — H&P (Signed)
Patient ID: [name] [name], male   DOB: [birth year], 65 y.o.   MRN: [id number]  History of Present Illness [name] [name] is a 65 y.o. male with choledocholithiasis. He percent with pleasant 24 hours of severe abdominal pain located in the right upper quadrant and also in the periumbilical area. Pain was 10 out of 10, dull and was out of eating when morphine was given in the ER. There were no specific aggravating factors. He did have nausea and vomiting multiple times and a low-grade temperature. He describes no chills. Apparently had an episode of abdominal pain and some discomfort for a few days ago and thought this was a pneumonia and was given antibiotics and sent home. Workup has revealed ultrasound showing dilation of the common bile duct and intrahepatic ducts with stones in the common bile duct. Also he had gallstones without definitive evidence of cholecystitis. His laboratory values in showed increase in the white count with evidence of biliary obstruction as well as a component of Gallstone pancreatitis. He has good cardiovascular reserve and is able to perform more than 4 Mets of activity is without any shortness of breath or chest pain  Past Medical History Past Medical History  Diagnosis Date  . Sleep apnea   . Dysrhythmia   . Hypertension   . GERD (gastroesophageal reflux disease)   . Arthritis   . HOH (hard of hearing)   . Chronic kidney disease     stones  . Prostate disease   . Hypothyroidism        Past Surgical History  Procedure Laterality Date  . Arm skin lesion biopsy / excision    . Kidney stone surgery    . Colonoscopy    . Incision and drainage      anal infection  . Cataract extraction w/phaco Right 02/23/2015    Procedure: CATARACT EXTRACTION PHACO AND INTRAOCULAR LENS PLACEMENT (IOC);  Surgeon: Galen ManilaWilliam Porfilio, MD;  Location: [facility] ORS;  Service: Ophthalmology;  Laterality: Right;  US 01:01 AP%23.5 CDE 14.49 fluid pack lot #[id number]  . Cataract extraction  w/phaco Left 03/30/2015    Procedure: CATARACT EXTRACTION PHACO AND INTRAOCULAR LENS PLACEMENT (IOC);  Surgeon: Galen ManilaWilliam Porfilio, MD;  Location: [facility] ORS;  Service: Ophthalmology;  Laterality: Left;  US: 01:08.3 AP%: 22.1 CDE: 15.12  Fluid Lot # [id number]  . Right ureterolithiasis with stent placement  04/29/2014    Dr. Sheppard PentonWolf    Allergies  Allergen Reactions  . Crestor  [Rosuvastatin Calcium]     Other reaction(s): Muscle Pain (severe)  . Ibuprofen   . Penicillins     Current Facility-Administered Medications  Medication Dose Route Frequency Provider Last Rate Last Dose  . ciprofloxacin (CIPRO) IVPB 400 mg  400 mg Intravenous Q12H Diego F Pabon, MD      . dextrose 5 %-0.9 % sodium chloride infusion   Intravenous Continuous Diego F Pabon, MD      . diphenhydrAMINE (BENADRYL) 12.5 MG/5ML elixir 12.5 mg  12.5 mg Oral Q6H PRN Diego F Pabon, MD       Or  . diphenhydrAMINE (BENADRYL) injection 12.5 mg  12.5 mg Intravenous Q6H PRN Diego F Pabon, MD      . levofloxacin (LEVAQUIN) IVPB 750 mg  750 mg Intravenous Once Governor Rooksebecca Lord, MD 100 mL/hr at 11/21/15 0322 750 mg at 11/21/15 0322  . metroNIDAZOLE (FLAGYL) IVPB 500 mg  500 mg Intravenous Once Governor Rooksebecca Lord, MD 100 mL/hr at 11/21/15 0311 500 mg at 11/21/15 0311  . metroNIDAZOLE (FLAGYL) IVPB 500  mg  500 mg Intravenous Q8H Diego F Pabon, MD      . morphine 2 MG/ML injection 2 mg  2 mg Intravenous Q2H PRN Diego F Pabon, MD      . ondansetron (ZOFRAN-ODT) disintegrating tablet 4 mg  4 mg Oral Q6H PRN Diego F Pabon, MD       Or  . ondansetron (ZOFRAN) injection 4 mg  4 mg Intravenous Q6H PRN Diego F Pabon, MD      . pantoprazole (PROTONIX) injection 40 mg  40 mg Intravenous QHS Leafy Ro, MD       Current Outpatient Prescriptions  Medication Sig Dispense Refill  . docusate sodium (COLACE) 100 MG capsule Take 100 mg by mouth daily.     Marland Kitchen levothyroxine (SYNTHROID, LEVOTHROID) 50 MCG tablet TAKE 1 TABLET BY MOUTH EVERY DAY 30 tablet 12  .  losartan (COZAAR) 50 MG tablet Take 1 tablet (50 mg total) by mouth daily. 90 tablet 4  . Multiple Vitamins-Minerals (MULTIVITAMIN ADULT PO) Take 1 tablet by mouth daily.    . ranitidine (ZANTAC) 150 MG tablet Take 150 mg by mouth daily.    . tadalafil (CIALIS) 5 MG tablet Take 1 tablet (5 mg total) by mouth daily. (Patient taking differently: Take 5 mg by mouth daily as needed for erectile dysfunction. ) 30 tablet 11  . aspirin 81 MG tablet Take 81 mg by mouth daily.      Family History Family History  Problem Relation Age of Onset  . Pancreatic cancer Mother   . Neuropathy Father   . Throat cancer Father   . Lung cancer Father      Social History Social History  Substance Use Topics  . Smoking status: Never Smoker   . Smokeless tobacco: None  . Alcohol Use: 0.0 oz/week    0 Standard drinks or equivalent per week     Comment: moderate use; 8-9 drinks a week       ROS upon review of system was performed and is otherwise negative other than what is stated in the history of present illness   Physical Exam Blood pressure 114/81, pulse 116, temperature 99.7 F (37.6 C), temperature source Oral, resp. rate 18, height  (1.88 m), weight 113.399 kg (250 lb), SpO2 94 %.  CONSTITUTIONAL: NAD, non toxic EYES: Pupils equal, round, and reactive to light, Sclera non-icteric. EARS, NOSE, MOUTH AND THROAT: The oropharynx is clear. Oral mucosa is pink and moist. Hearing is intact to voice.  NECK: Trachea is midline, and there is no jugular venous distension. Thyroid is without palpable abnormalities. LYMPH NODES:  Lymph nodes in the neck are not enlarged. RESPIRATORY:  Lungs are clear, and breath sounds are equal bilaterally. Normal respiratory effort without pathologic use of accessory muscles. CARDIOVASCULAR: Heart is regular without murmurs, gallops, or rubs. GI: The abdomen is soft,  mild tenderness to palpation in the right upper quadrant, no peritonitis,  nondistended. There were  no palpable masses. There was no hepatosplenomegaly. There were normal bowel sounds.  MUSCULOSKELETAL:  Normal muscle strength and tone in all four extremities.    SKIN: Skin turgor is normal. There are no pathologic skin lesions.  NEUROLOGIC:  Motor and sensation is grossly normal.  Cranial nerves are grossly intact. PSYCH:  Alert and oriented to person, place and time. Affect is normal.  Data Reviewed I have personally reviewed the patient's imaging and medical records.    Assessment/Plan 65 year old male with choledocholithiasis and early cholangitis with a component  of gallstone pancreatitis. We will definitely admit him start him on IV antibiotics, IV fluids keep him nothing by mouth and he will need an ERC to decompress his biliary tree. We'll ask one of our GI colleagues hopefully they will do an ERCP either today or tomorrow. He is not toxic and there is no need for any immediate surgical attention. He will eventually need his laparoscopic cholecystectomy during this hospitalization but first order of business would be to decompress his biliary tree. I personally reviewed all his medical records, imaging studies and have provided extensive counseling to the patient. They understand and agree with the plan   Sterling Big, MD [facility] F Pabon 11/21/2015, 3:35 AM

## 2015-11-21 NOTE — ED Notes (Signed)
Pt's o2sat 88% on RA, dr. Shaune PollackLord at bedside, pt placed on o2 2L via  [place]

## 2015-11-21 NOTE — ED Notes (Signed)
Pt given a few ice chips per pt request, okay w/ dr. Shaune Pollacklord

## 2015-11-21 NOTE — ED Notes (Signed)
Pt taken to US

## 2015-11-21 NOTE — Consult Note (Signed)
GI Consultation Note  Referring Provider: Jules Husbands, MD Date of Consult: 11/21/2015  HPI: [name] [name] is a 65 y.o. male being seen in consultation at the request of Jules Husbands, MD for gallstone pancreatitis.  [name] [name] is a 65 y.o. male with a PMH significant for sleep apnea, HTN, and gerd, who presented last night with significant RUQ pain and vomiting. He noted that he has experienced a few episodes of this recently, one of which was 10-12 days ago when he was evaluated in the ED and diagnosed with pneumonia. He describes severe RUQ pain and vomiting, along with less severe mid-epigastric, periumbilical and LUQ pain. He felt that he may have a low grade fever at times with this.  He denies any history of jaundice or scleral icterus.  He previously experienced acid reflux symptoms, but never experienced pain to this degree until the past few weeks. He stopped taking a PPI after he was diagnosed with pneumonia. He feels that his pneumonia may have been from aspiration due to his reported vomiting episodes preceding this.   The pt was found to have choledocholithiasis and dilation of the CBD to 13 mm on RUQ [place] on initial evaluation. He also has a lipase over 7000.  He notes that the abdominal pain resolved with a dose of morphine at 0400 and his vomiting has also resolved.  PMH: Past Medical History  Diagnosis Date  . Sleep apnea   . Dysrhythmia   . Hypertension   . GERD (gastroesophageal reflux disease)   . Arthritis   . HOH (hard of hearing)   . Chronic kidney disease     stones  . Prostate disease   . Hypothyroidism     PSH: Past Surgical History  Procedure Laterality Date  . Arm skin lesion biopsy / excision    . Kidney stone surgery    . Colonoscopy    . Incision and drainage      anal infection  . Cataract extraction w/phaco Right 02/23/2015    Procedure: CATARACT EXTRACTION PHACO AND INTRAOCULAR LENS PLACEMENT (IOC);  Surgeon: Birder Robson, MD;  Location: [facility]  ORS;  Service: Ophthalmology;  Laterality: Right;  [place] 01:01 AP%23.5 CDE 14.49 fluid pack lot #[id number]  . Cataract extraction w/phaco Left 03/30/2015    Procedure: CATARACT EXTRACTION PHACO AND INTRAOCULAR LENS PLACEMENT (IOC);  Surgeon: Birder Robson, MD;  Location: [facility] ORS;  Service: Ophthalmology;  Laterality: Left;  [place]: 01:08.3 AP%: 22.1 CDE: 15.12  Fluid Lot # [id number]  . Right ureterolithiasis with stent placement  04/29/2014    Dr. Eliberto Ivory    Family History: Family History  Problem Relation Age of Onset  . Pancreatic cancer Mother   . Neuropathy Father   . Throat cancer Father   . Lung cancer Father     Social History: Social History   Social History  . Marital Status: Married    Spouse Name: N/A  . Number of Children: 3  . Years of Education: N/A   Occupational History  . Self employed     Owns IT sales professional   Social History Main Topics  . Smoking status: Never Smoker   . Smokeless tobacco: Not on file  . Alcohol Use: 0.0 oz/week    0 Standard drinks or equivalent per week     Comment: moderate use; 8-9 drinks a week  . Drug Use: No  . Sexual Activity: Not on file   Other Topics Concern  . Not on file   Social  History Narrative    ROS: The balance of a 12 system review is negative other than as described in the HPI.  SCHEDULED MEDS: . antiseptic oral rinse  7 mL Mouth Rinse BID  . ciprofloxacin  400 mg Intravenous Q12H  . metronidazole  500 mg Intravenous Q8H  . pantoprazole (PROTONIX) IV  40 mg Intravenous QHS    PHYSICAL EXAM: Filed Vitals:   11/21/15 0358 11/21/15 0425  BP: 101/81 101/67  Pulse: 113 113  Temp:  98.9 F (37.2 C)  Resp: 16 20    GEN: Alert, oriented x3 in no apparent distress. HEENT: Oropharynx clear. Milld scleral icterus CV: Nl rate, nl rhythm. No murmurs, rubs or gallops. LUNGS: Clear to auscultation bilaterally. No wheezes, rales or rhonchi. ABD: Bowel sounds present. Abdomen soft, nontender,  nondistended. (previously tender in the RUQ and mid-epigastric per pt) EXT: no edema NEURO: no focal neurologic deficits.  LABS: CBC Latest Ref Rng 11/21/2015 11/20/2015 11/04/2015  WBC 3.8 - 10.6 K/uL 12.3(H) 12.9(H) 8.4  Hemoglobin 13.0 - 18.0 g/dL 14.0 14.6 14.3  Hematocrit 40.0 - 52.0 % 42.1 43.2 42.4  Platelets 150 - 440 K/uL 214 214 185    BMP Latest Ref Rng 11/21/2015 11/20/2015 11/04/2015  Glucose 65 - 99 mg/dL 156(H) 151(H) 142(H)  BUN 6 - 20 mg/dL 23(H) 24(H) 21(H)  Creatinine 0.61 - 1.24 mg/dL 1.19 1.20 1.06  Sodium 135 - 145 mmol/L 140 137 138  Potassium 3.5 - 5.1 mmol/L 3.9 3.6 3.9  Chloride 101 - 111 mmol/L 109 103 106  CO2 22 - 32 mmol/L _0 Calcium 8.9 - 10.3 mg/dL 8.7(L) 9.3 9.1    Hepatic Function Latest Ref Rng 11/21/2015 11/20/2015 01/05/2015  Total Protein 6.5 - 8.1 g/dL 6.6 7.3 -  Albumin 3.5 - 5.0 g/dL 3.7 4.3 -  AST 15 - 41 U/L 117(H) 155(H) 13(A)  ALT 17 - 63 U/L 140(H) 173(H) 13  Alk Phosphatase 38 - 126 U/L 118 149(H) -  Total Bilirubin 0.3 - 1.2 mg/dL 5.1(H) 5.6(H) -  Bilirubin, Direct 0.1 - 0.5 mg/dL 3.2(H) - -   Lipase     Component Value Date/Time   LIPASE 7442* 11/20/2015 2338   ASSESSMENT: [name] is a 65 y.o. male presenting with choledocholithiasis and pancreatitis. He presented with RUQ pain that resolved after pain medication and has an elevated bilirubin with dilated CBD.  He has not had a documented fever to this point, thus he does not meet Charcot's triad for cholangitis. Additionally, he appears relatively well at this point in time. As such, we would prefer to wait for ERCP until his pancreatitis improves somewhat, perhaps even tomorrow we can perform. This would allow for safer biliary evaluation and sweep, along with sphincterotomy as necessary, without putting his pancreas at further risk from ERCP.  However, if he develops signs of acute cholangitis, this will need to be re-evaluated as more expedited ERCP is warranted in acute  cholangitis, even in the setting of pancreatitis. For now, I would continue to monitor his abdominal symptoms and maintain NPO status.  RECOMMENDATIONS: - NPO status for now - monitor for signs of fever or decompensation - continue aggressive hydration  - consider timing of ERCP as detailed above  Thank you for involving [place] in the care of this patient. Please do not hesitate to contact with any further questions.  Edward L. Drema Dallas, MD, MPH

## 2015-11-21 NOTE — Progress Notes (Signed)
CC: Abdominal pain Subjective: Patient's care taken over this morning from Dr. Everlene FarrierPabon. Patient reports that since admission his pain has basically subsided. He states he is very thirsty and would like to have something to drink.  Objective: Vital signs in last 24 hours: Temp:  [98.3 F (36.8 C)-99.7 F (37.6 C)] 98.9 F (37.2 C) (03/26 0425) Pulse Rate:  [106-116] 113 (03/26 0425) Resp:  [14-22] 20 (03/26 0425) BP: (101-154)/(67-93) 101/67 mmHg (03/26 0425) SpO2:  [92 %-99 %] 99 % (03/26 0425) Weight:  [113.399 kg (250 lb)] 113.399 kg (250 lb) (03/25 2322) Last BM Date: 11/20/15  Intake/Output from previous day:   Intake/Output this shift: Total I/O In: 464 [I.V.:464] Out: 300 [Urine:300]  Physical exam:  Gen.: No acute distress Chest: Clear to auscultation Heart: Tachycardic Abdomen: Soft, nontender, nondistended   Lab Results: CBC   Recent Labs  11/20/15 2338 11/21/15 0739  WBC 12.9* 12.3*  HGB 14.6 14.0  HCT 43.2 42.1  PLT 214 214   BMET  Recent Labs  11/20/15 2338 11/21/15 0739  NA 137 140  K 3.6 3.9  CL 103 109  CO2 25 25  GLUCOSE 151* 156*  BUN 24* 23*  CREATININE 1.20 1.19  CALCIUM 9.3 8.7*   PT/INR  Recent Labs  11/20/15 2338  LABPROT 14.4  INR 1.10   ABG No results for input(s): PHART, HCO3 in the last 72 hours.  Invalid input(s): PCO2, PO2  Studies/Results: Dg Chest Portable 1 View  11/21/2015  CLINICAL DATA:  65 year old male with intermittent right-sided abdominal pain and hypoxia. EXAM: PORTABLE CHEST 1 VIEW COMPARISON:  Chest radiograph dated 11/18/2015 FINDINGS: Single-view of the chest demonstrates clear lungs. There is no pleural effusion or pneumothorax. The cardiac silhouette is within normal limits. No acute osseous pathology. IMPRESSION: No active disease. Electronically Signed   By: Elgie CollardArash  Radparvar M.D.   On: 11/21/2015 03:21   [place] Abdomen Limited Ruq  11/21/2015  CLINICAL DATA:  Right upper quadrant pain for 1 day.  Elevated liver function studies. Elevated white cell count. EXAM: US ABDOMEN LIMITED - RIGHT UPPER QUADRANT COMPARISON:  CT abdomen and pelvis 05/16/2014 FINDINGS: Gallbladder: Multiple small stones and sludge are demonstrated layering in the gallbladder. Gallbladder is mildly distended. Gallbladder wall is not thickened and no edema is present. Murphy's sign is nonspecific due to morphine. Common bile duct: Diameter: Common bile duct is dilated to 13 mm diameter. Mild intrahepatic bile duct dilatation. Multiple stones are visualized in the distal common bile duct. Liver: No focal lesion identified. Within normal limits in parenchymal echogenicity. IMPRESSION: Distal common bile duct stones with mild intra and extrahepatic bile duct dilatation. Cholelithiasis with sludge in the distended gallbladder. Electronically Signed   By: Burman NievesWilliam  Stevens M.D.   On: 11/21/2015 02:07    Anti-infectives: Anti-infectives    Start     Dose/Rate Route Frequency Ordered Stop   11/21/15 0345  metroNIDAZOLE (FLAGYL) IVPB 500 mg     500 mg 100 mL/hr over 60 Minutes Intravenous Every 8 hours 11/21/15 0334     11/21/15 0345  ciprofloxacin (CIPRO) IVPB 400 mg    Comments:  Advanced time to start ~24 hours after Levaquin.   400 mg 200 mL/hr over 60 Minutes Intravenous Every 12 hours 11/21/15 0334     11/21/15 0300  metroNIDAZOLE (FLAGYL) IVPB 500 mg     500 mg 100 mL/hr over 60 Minutes Intravenous  Once 11/21/15 0247 11/21/15 0412   11/21/15 0300  levofloxacin (LEVAQUIN) IVPB 750 mg  750 mg 100 mL/hr over 90 Minutes Intravenous  Once 11/21/15 0247 11/21/15 0452      Assessment/Plan:  65 year old male admitted with gallstone pancreatitis and question of cholangitis. Symptomatically patient has completely resolved. Discussed with patient that ERCP would likely be done tomorrow. We will allow for a clear liquid diet today but discussed with the patient that should his pain return he is to have nothing by mouth.  Continue to trend liver function and pancreatitis labs. Encourage ambulation and incentive spirometer usage. He will likely require cholecystectomy prior to discharge.  Mishka Stegemann T. Tonita Cong, MD, FACS  11/21/2015

## 2015-11-21 NOTE — ED Provider Notes (Signed)
[facility] Emergency Department Provider Note   ____________________________________________  Time seen:  I have reviewed the triage vital signs and the triage nursing note.  HISTORY  Chief Complaint Abdominal Pain; Fever; and Nausea   Historian Patient and spouse  HPI [name] [name] is a 65 y.o. male with a history of prior GERD and kidney stones, is here with severe right abdominal/flank pain which started back today. He reports that last week he had somewhat similar pain and was diagnosed with pneumonia and completed a course of antibiotics. He is not reporting cough or shortness of breath. He states that when is in the hospital last week he did wear oxygen during the hospital stay.  Patient feels worse than prior kidney stones. He's had 2 prior lithotripsy reportedly. No fever. He has had a lot of burping and belching and upper/epigastric pain and right-sided upper abdominal pain. No known biliary disease.  Pain is severe. Nothing seems to make it worse or better.    Past Medical History  Diagnosis Date  . Sleep apnea   . Dysrhythmia   . Hypertension   . GERD (gastroesophageal reflux disease)   . Arthritis   . HOH (hard of hearing)   . Chronic kidney disease     stones  . Prostate disease   . Hypothyroidism     Patient Active Problem List   Diagnosis Date Noted  . Right flank pain 07/02/2015  . Hyperlipidemia 04/13/2015  . Hypersomnia 04/13/2015  . Kidney stones 04/13/2015  . Elevated transaminase level 04/13/2015  . History of nephrolithiasis 04/13/2015  . Obstructive sleep apnea 04/13/2015  . Obesity 04/13/2015  . Hypothyroidism 03/03/2015  . Renal colic 04/28/2014  . Benign prostatic hyperplasia with urinary obstruction 07/17/2012  . Chronic prostatitis 07/17/2012  . Calculus of kidney 07/17/2012  . Decreased libido 07/17/2012  . Elevated prostate specific antigen (PSA) 07/17/2012  . ED (erectile dysfunction) of organic origin  07/17/2012  . Neuralgia neuritis, sciatic nerve 07/17/2012  . Family history of endocrine and metabolic disease 96/11/5407  . 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 Essential hypertension 02/16/2006    Past Surgical History  Procedure Laterality Date  . Arm skin lesion biopsy / excision    . Kidney stone surgery    . Colonoscopy    . Incision and drainage      anal infection  . Cataract extraction w/phaco Right 02/23/2015    Procedure: CATARACT EXTRACTION PHACO AND INTRAOCULAR LENS PLACEMENT (IOC);  Surgeon: Galen Manila, MD;  Location: [facility] ORS;  Service: Ophthalmology;  Laterality: Right;  [place] 01:01 AP%23.5 CDE 14.49 fluid pack lot #[id number]  . Cataract extraction w/phaco Left 03/30/2015    Procedure: CATARACT EXTRACTION PHACO AND INTRAOCULAR LENS PLACEMENT (IOC);  Surgeon: Galen Manila, MD;  Location: [facility] ORS;  Service: Ophthalmology;  Laterality: Left;  [place]: 01:08.3 AP%: 22.1 CDE: 15.12  Fluid Lot # [id number]  . Right ureterolithiasis with stent placement  04/29/2014    Dr. Sheppard Penton    Current Outpatient Rx  Name  Route  Sig  Dispense  Refill  . docusate sodium (COLACE) 100 MG capsule   Oral   Take 100 mg by mouth daily.          Marland Kitchen levothyroxine (SYNTHROID, LEVOTHROID) 50 MCG tablet      TAKE 1 TABLET BY MOUTH EVERY DAY   30 tablet   12   . losartan (COZAAR) 50 MG tablet   Oral   Take 1 tablet (50 mg total)  by mouth daily.   90 tablet   4   . Multiple Vitamins-Minerals (MULTIVITAMIN ADULT PO)   Oral   Take 1 tablet by mouth daily.         . ranitidine (ZANTAC) 150 MG tablet   Oral   Take 150 mg by mouth daily.         . tadalafil (CIALIS) 5 MG tablet   Oral   Take 1 tablet (5 mg total) by mouth daily. Patient taking differently: Take 5 mg by mouth daily as needed for erectile dysfunction.    30 tablet   11   . aspirin 81 MG tablet   Oral   Take 81 mg by mouth daily.           Allergies Crestor ; Ibuprofen; and  Penicillins  Family History  Problem Relation Age of Onset  . Pancreatic cancer Mother   . Neuropathy Father   . Throat cancer Father   . Lung cancer Father     Social History Social History  Substance Use Topics  . Smoking status: Never Smoker   . Smokeless tobacco: None  . Alcohol Use: 0.0 oz/week    0 Standard drinks or equivalent per week     Comment: moderate use; 8-9 drinks a week    Review of Systems  Constitutional: Negative for fever. Eyes: Negative for visual changes. ENT: Negative for sore throat. Cardiovascular: Negative for chest pain. Respiratory: Negative for shortness of breath. Gastrointestinal: Negative for diarrhea. Genitourinary: Negative for dysuria. Musculoskeletal: Negative for back pain. Skin: Negative for rash. Neurological: Negative for headache. 10 point Review of Systems otherwise negative ____________________________________________   PHYSICAL EXAM:  VITAL SIGNS: ED Triage Vitals  Enc Vitals Group     BP 11/20/15 2322 154/78 mmHg     Pulse Rate 11/20/15 2322 116     Resp 11/20/15 2322 22     Temp 11/20/15 2322 98.3 F (36.8 C)     Temp Source 11/20/15 2322 Oral     SpO2 11/20/15 2322 95 %     Weight 11/20/15 2322 250 lb (113.399 kg)     Height 11/20/15 2322 6\' 2"  (1.88 m)     Head Cir --      Peak Flow --      Pain Score 11/20/15 2323 10     Pain Loc --      Pain Edu? --      Excl. in GC? --      Constitutional: Alert and oriented. Wincing due to acute pain.Marland Kitchen. HEENT   Head: Normocephalic and atraumatic.      Eyes: Conjunctivae are normal. PERRL. Normal extraocular movements.      Ears:         Nose: No congestion/rhinnorhea.   Mouth/Throat: Mucous membranes are moist.   Neck: No stridor. Cardiovascular/Chest: Normal rate, regular rhythm.  No murmurs, rubs, or gallops. Respiratory: Normal respiratory effort without tachypnea nor retractions. Breath sounds are clear and equal bilaterally. No  wheezes/rales/rhonchi. Gastrointestinal: Soft. No distention, no guarding, no rebound. Moderate tenderness in the epigastrium and right side abdomen without focal McBurney's point tenderness.  Genitourinary/rectal:Deferred Musculoskeletal: Nontender with normal range of motion in all extremities. No joint effusions.  No lower extremity tenderness.  No edema. Neurologic:  Normal speech and language. No gross or focal neurologic deficits are appreciated. Skin:  Skin is warm, dry and intact. No rash noted. Psychiatric: Mood and affect are normal. Speech and behavior are normal. Patient exhibits appropriate insight and  judgment.  ____________________________________________   EKG I, Governor Rooks, MD, the attending physician have personally viewed and interpreted all ECGs.  109 bpm. Sinus tachycardia. Incomplete right bundle blanch block. Normal axis. Nonspecific ST and T-wave ____________________________________________  LABS (pertinent positives/negatives)  Comprehensive metabolic panel significant for BUN 24, AST 155, a LT 173, alkaline phosphatase 139, bilirubin 5.6 White blood cell count 12.9, hemoglobin 14.6 and platelet count 214 Lipase 7442  ____________________________________________  RADIOLOGY All Xrays were viewed by me. Imaging interpreted by Radiologist.  Ultrasound right upper quadrant:  IMPRESSION: Distal common bile duct stones with mild intra and extrahepatic bile duct dilatation. Cholelithiasis with sludge in the distended Gallbladder.  Chest x-ray: No infiltrate __________________________________________  PROCEDURES  Procedure(s) performed: None  Critical Care performed: None  ____________________________________________   ED COURSE / ASSESSMENT AND PLAN  Pertinent labs & imaging results that were available during my care of the patient were reviewed by me and considered in my medical decision making (see chart for details).   My clinical impression  was initially kidney stone pain given the flank aspect, history of kidney stones, and the fact that he cannot find a comfortable position.  However on exam his abdomen was more tender than I felt would be typical for kidney stone pain.  On laboratory evaluation he was found to have elevated LFTs and a bilirubin of 5.6. Ultrasound was obtained showing gallstones sludge and distal common bile duct stones.  Lipase elevated. Diagnosis consistent with strict of gallstone pancreatitis. Given the possibility for cholecystitis, with low-grade temperature, and elevated white blood cell count as well as tachycardia, I will go ahead and cover with antibiotics. Patient is allergic to penicillin, so metronidazole and Levaquin IV were chosen.   I discussed this case with Dr. Everlene Farrier, general surgery who will come and evaluate the patient for further management/hospitalization.  Patient was slightly hypoxic 88-90% on room air, and was placed on 2 L nasal cannula. I did add on a chest x-ray.  Chest x-ray showed no infiltrate. I suspect this may be a bit of sleep apnea or hypo-ventilation while resting especially after pain medication.   CONSULTATIONS:  General Surgeon, Dr. Everlene Farrier, plans to admit.   Patient / Family / Caregiver informed of clinical course, medical decision-making process, and agree with plan.      ___________________________________________   FINAL CLINICAL IMPRESSION(S) / ED DIAGNOSES   Final diagnoses:  RUQ pain  Acute gallstone pancreatitis  Pancreatitis due to common bile duct stone  Hypoxia              Note: This dictation was prepared with Dragon dictation. Any transcriptional errors that result from this process are unintentional   Governor Rooks, MD 11/21/15 [phone]

## 2015-11-21 NOTE — ED Notes (Signed)
Pt's O2sat drop to 87% on 2L [place], O2 increased to 3L, dr. Shaune PollackLord informed.

## 2015-11-22 ENCOUNTER — Encounter: Payer: Self-pay | Admitting: Anesthesiology

## 2015-11-22 ENCOUNTER — Inpatient Hospital Stay: Payer: BLUE CROSS/BLUE SHIELD | Admitting: Anesthesiology

## 2015-11-22 ENCOUNTER — Encounter
Admission: EM | Disposition: A | Discharge status: Home / Self Care | Payer: Self-pay | Source: Home / Self Care | Attending: Surgery

## 2015-11-22 HISTORY — PX: ENDOSCOPIC RETROGRADE CHOLANGIOPANCREATOGRAPHY (ERCP) WITH PROPOFOL: SHX5810

## 2015-11-22 LAB — COMPREHENSIVE METABOLIC PANEL
ALT: 100 U/L — ABNORMAL HIGH (ref 17–63)
ANION GAP: 3 — AB (ref 5–15)
AST: 56 U/L — AB (ref 15–41)
Albumin: 3 g/dL — ABNORMAL LOW (ref 3.5–5.0)
Alkaline Phosphatase: 106 U/L (ref 38–126)
BILIRUBIN TOTAL: 4.1 mg/dL — AB (ref 0.3–1.2)
BUN: 15 mg/dL (ref 6–20)
CHLORIDE: 111 mmol/L (ref 101–111)
CO2: 25 mmol/L (ref 22–32)
Calcium: 7.9 mg/dL — ABNORMAL LOW (ref 8.9–10.3)
Creatinine, Ser: 0.88 mg/dL (ref 0.61–1.24)
GFR calc Af Amer: >60 mL/min (ref >60)
Glucose, Bld: 108 mg/dL — ABNORMAL HIGH (ref 65–99)
POTASSIUM: 3.6 mmol/L (ref 3.5–5.1)
Sodium: 139 mmol/L (ref 135–145)
TOTAL PROTEIN: 5.5 g/dL — AB (ref 6.5–8.1)

## 2015-11-22 LAB — CBC
HCT: 37.5 % — ABNORMAL LOW (ref 40.0–52.0)
Hemoglobin: 12.6 g/dL — ABNORMAL LOW (ref 13.0–18.0)
MCH: 30.7 pg (ref 26.0–34.0)
MCHC: 33.6 g/dL (ref 32.0–36.0)
MCV: 91.5 fL (ref 80.0–100.0)
PLATELETS: 177 10*3/uL (ref 150–440)
RBC: 4.1 MIL/uL — ABNORMAL LOW (ref 4.40–5.90)
RDW: 14.5 % (ref 11.5–14.5)
WBC: 5.2 10*3/uL (ref 3.8–10.6)

## 2015-11-22 LAB — LIPASE, BLOOD: LIPASE: 95 U/L — AB (ref 11–51)

## 2015-11-22 SURGERY — ENDOSCOPIC RETROGRADE CHOLANGIOPANCREATOGRAPHY (ERCP) WITH PROPOFOL
Anesthesia: General

## 2015-11-22 MED ORDER — FENTANYL CITRATE (PF) 100 MCG/2ML IJ SOLN
INTRAMUSCULAR | Status: DC | PRN
  Administered 2015-11-22 (×2): 50 ug via INTRAVENOUS

## 2015-11-22 MED ORDER — PHENYLEPHRINE HCL 10 MG/ML IJ SOLN
INTRAMUSCULAR | Status: DC | PRN
  Administered 2015-11-22: 100 ug via INTRAVENOUS

## 2015-11-22 MED ORDER — SUCCINYLCHOLINE CHLORIDE 20 MG/ML IJ SOLN
INTRAMUSCULAR | Status: DC | PRN
  Administered 2015-11-22: 120 mg via INTRAVENOUS

## 2015-11-22 MED ORDER — INDOMETHACIN 50 MG RE SUPP
50.0000 mg | Freq: Once | RECTAL | Status: AC
  Administered 2015-11-22: 100 mg via RECTAL
  Filled 2015-11-22: qty 1

## 2015-11-22 MED ORDER — FENTANYL CITRATE (PF) 100 MCG/2ML IJ SOLN: 25.0000 ug | INTRAMUSCULAR | Status: DC | PRN

## 2015-11-22 MED ORDER — ROCURONIUM BROMIDE 100 MG/10ML IV SOLN
INTRAVENOUS | Status: DC | PRN
  Administered 2015-11-22: 20 mg via INTRAVENOUS

## 2015-11-22 MED ORDER — SODIUM CHLORIDE 0.9 % IV SOLN
INTRAVENOUS | Status: DC | PRN
  Administered 2015-11-22: 16:00:00 via INTRAVENOUS

## 2015-11-22 MED ORDER — ONDANSETRON HCL 4 MG/2ML IJ SOLN: 4.0000 mg | Freq: Once | INTRAMUSCULAR | Status: DC | PRN

## 2015-11-22 MED ORDER — DEXAMETHASONE SODIUM PHOSPHATE 10 MG/ML IJ SOLN
INTRAMUSCULAR | Status: DC | PRN
  Administered 2015-11-22: 10 mg via INTRAVENOUS

## 2015-11-22 MED ORDER — PROPOFOL 10 MG/ML IV BOLUS
INTRAVENOUS | Status: DC | PRN
  Administered 2015-11-22: 150 mg via INTRAVENOUS

## 2015-11-22 MED ORDER — LIDOCAINE HCL (CARDIAC) 20 MG/ML IV SOLN
INTRAVENOUS | Status: DC | PRN
  Administered 2015-11-22: 60 mg via INTRAVENOUS

## 2015-11-22 MED ORDER — MIDAZOLAM HCL 2 MG/2ML IJ SOLN
INTRAMUSCULAR | Status: DC | PRN
  Administered 2015-11-22 (×2): 1 mg via INTRAVENOUS

## 2015-11-22 MED ORDER — SUGAMMADEX SODIUM 200 MG/2ML IV SOLN
INTRAVENOUS | Status: DC | PRN
  Administered 2015-11-22: 226.8 mg via INTRAVENOUS

## 2015-11-22 NOTE — Anesthesia Postprocedure Evaluation (Signed)
Anesthesia Post Note  Patient: [name] [name]  Procedure(s) Performed: Procedure(s) (LRB): ENDOSCOPIC RETROGRADE CHOLANGIOPANCREATOGRAPHY (ERCP) WITH PROPOFOL (N/A)  Patient location during evaluation: PACU Anesthesia Type: General Level of consciousness: awake and alert Pain management: pain level controlled Vital Signs Assessment: post-procedure vital signs reviewed and stable Respiratory status: spontaneous breathing, nonlabored ventilation, respiratory function stable and patient connected to nasal cannula oxygen Cardiovascular status: blood pressure returned to baseline and stable Postop Assessment: no signs of nausea or vomiting Anesthetic complications: no    Last Vitals:  Filed Vitals:   11/22/15 1704 11/22/15 1719  BP: 129/98 134/95  Pulse: 94 84  Temp: 37.4 C   Resp: 21 14    Last Pain:  Filed Vitals:   11/22/15 1720  PainSc: 0-No pain                 Yevette EdwardsJames G Adams

## 2015-11-22 NOTE — Progress Notes (Signed)
65 yr old male with gallstone pancreatitis and choledocholithiasis.  Patient states he is feeling much better today.  Some pain occasionally in RUQ.  Patient denies any nausea or vomiting today.   Filed Vitals:   11/22/15 1158 11/22/15 1501  BP: 119/80 133/88  Pulse: 71 73  Temp: 98 F (36.7 C) 99 F (37.2 C)  Resp: 16 18   I/O last 3 completed shifts: In: 4451.9 [P.O.:1120; I.V.:3331.9] Out: 1225 [Urine:1225] Total I/O In: 1867.5 [I.V.:1167.5; IV Piggyback:700] Out: 925 [Urine:925]   PE:  Gen: NAD Res: CTAB/L Cardio: RRR Abd: soft, moderately distended, non-tender Ext: 2+ pulses, no edema  A/P: 65 yr old with gallstone pancreatitis and choledocholithiasis  ERCP today with Dr. Servando SnareWohl.  Will schedule for Lap chole tomorrow. The risks, benefits, complications, treatment options, and expected outcomes were discussed with the patient. The possibilities of bleeding, recurrent infection, finding a normal gallbladder, perforation of viscus organs, damage to surrounding structures, bile leak, abscess formation, needing a drain placed, the need for additional procedures, reaction to medication, pulmonary aspiration,  failure to diagnose a condition, the possible need to convert to an open procedure, and creating a complication requiring transfusion or operation were discussed with the patient. The patient and/or family concurred with the proposed plan, giving informed consent.

## 2015-11-22 NOTE — Op Note (Signed)
[facility] Gastroenterology Patient Name: [name] EatonDonald [name] Procedure Date: 11/22/2015 3:43 PM MRN: [id number] Account #: [id number] Date of Birth: 09-29-1950 Admit Type: Inpatient Age: [age] [facility] 4 Gender: Male Note Status: Finalized Procedure:            ERCP Indications:          Suspected bile duct stone(s) Providers:            Midge Miniumarren Keelie Zemanek, MD Referring MD:         [name], MD (Referring MD) Medicines:            General Anesthesia Complications:        No immediate complications. Procedure:            Pre-Anesthesia Assessment:                       - Prior to the procedure, a History and Physical was                        performed, and patient medications and allergies were                        reviewed. The patient's tolerance of previous                        anesthesia was also reviewed. The risks and benefits of                        the procedure and the sedation options and risks were                        discussed with the patient. All questions were                        answered, and informed consent was obtained. Prior                        Anticoagulants: The patient has taken no previous                        anticoagulant or antiplatelet agents. ASA Grade                        Assessment: II - A patient with mild systemic disease.                        After reviewing the risks and benefits, the patient was                        deemed in satisfactory condition to undergo the                        procedure.                       After obtaining informed consent, the scope was passed                        under direct vision. Throughout the procedure, the  patient's blood pressure, pulse, and oxygen saturations                        were monitored continuously. The Endosonoscope was                        introduced through the mouth, and used to inject                        contrast  into and used to inject contrast into the bile                        duct. The ERCP was accomplished without difficulty. The                        patient tolerated the procedure well. Findings:      The scout film was normal. The scope was advanced to a normal major       papilla in the descending duodenum. Examination of the pharynx, larynx       and associated structures, and upper GI tract was normal. The bile duct       was deeply cannulated with the short-nosed traction sphincterotome.       Contrast was injected. I personally interpreted the bile duct images.       There was brisk flow of contrast through the ducts. Image quality was       excellent. Contrast extended to the entire biliary tree. The lower third       of the main bile duct contained filling defect(s) thought to be a stone.       A wire was passed into the biliary tree. Biliary sphincterotomy was made       with a traction (standard) sphincterotome using ERBE electrocautery.       There was no post-sphincterotomy bleeding. The biliary tree was swept       with a 15 mm balloon starting at the bifurcation. All stones were       removed. Impression:           - A filling defect consistent with a stone was seen on                        the cholangiogram.                       - Choledocholithiasis was found. Complete removal was                        accomplished by biliary sphincterotomy and balloon                        extraction.                       - A biliary sphincterotomy was performed.                       - The biliary tree was swept. Recommendation:       - Watch for pancreatitis, bleeding, perforation, and                        cholangitis. Procedure Code(s):    --- Professional ---  [phone], Endoscopic retrograde cholangiopancreatography                        (ERCP); with removal of calculi/debris from                        biliary/pancreatic duct(s)                        [id number], Endoscopic retrograde cholangiopancreatography                        (ERCP); with sphincterotomy/papillotomy                       [phone], Endoscopic catheterization of the biliary ductal                        system, radiological supervision and interpretation Diagnosis Code(s):    --- Professional ---                       K80.50, Calculus of bile duct without cholangitis or                        cholecystitis without obstruction CPT copyright 2016 American [employer] Association. All rights reserved. The codes documented in this report are preliminary and upon coder review may  be revised to meet current compliance requirements. Midge Minium, MD 11/22/2015 4:46:49 PM This report has been signed electronically. Number of Addenda: 0 Note Initiated On: 11/22/2015 3:43 PM      [facility]

## 2015-11-22 NOTE — Transfer of Care (Signed)
Immediate Anesthesia Transfer of Care Note  Patient: [name] EatonDonald [name]  Procedure(s) Performed: Procedure(s): ENDOSCOPIC RETROGRADE CHOLANGIOPANCREATOGRAPHY (ERCP) WITH PROPOFOL (N/A)  Patient Location: PACU  Anesthesia Type:General  Level of Consciousness: awake, alert , oriented and patient cooperative  Airway & Oxygen Therapy: Patient Spontanous Breathing and Patient connected to face mask oxygen  Post-op Assessment: Report given to RN, Post -op Vital signs reviewed and stable and Patient moving all extremities X 4  Post vital signs: Reviewed and stable  Last Vitals:  Filed Vitals:   11/22/15 1158 11/22/15 1501  BP: 119/80 133/88  Pulse: 71 73  Temp: 36.7 C 37.2 C  Resp: 16 18    Complications: No apparent anesthesia complications

## 2015-11-22 NOTE — Anesthesia Preprocedure Evaluation (Addendum)
Anesthesia Evaluation  Patient identified by MRN, date of birth, ID band Patient awake    Reviewed: Allergy & Precautions, H&P , NPO status , Patient's Chart, lab work & pertinent test results, reviewed documented beta blocker date and time   Airway Mallampati: II  TM Distance: >3 FB Neck ROM: full    Dental no notable dental hx.    Pulmonary neg shortness of breath, sleep apnea and Continuous Positive Airway Pressure Ventilation , neg COPD, neg recent URI,    Pulmonary exam normal breath sounds clear to auscultation       Cardiovascular Exercise Tolerance: Good hypertension, Pt. on medications (-) angina(-) CAD, (-) Past MI, (-) Cardiac Stents and (-) CABG negative cardio ROS Normal cardiovascular exam+ dysrhythmias  Rhythm:regular Rate:Normal     Neuro/Psych PSYCHIATRIC DISORDERS ADHDSciatic nerve neuritis  Neuromuscular disease negative neurological ROS  negative psych ROS   GI/Hepatic Neg liver ROS, GERD  Medicated and Controlled,  Endo/Other  neg diabetesHypothyroidism   Renal/GU Renal InsufficiencyRenal disease  negative genitourinary   Musculoskeletal  (+) Arthritis , Osteoarthritis,    Abdominal   Peds negative pediatric ROS (+)  Hematology negative hematology ROS (+)   Anesthesia Other Findings Past Medical History:   Sleep apnea                                                  Dysrhythmia                                                  Hypertension                                                 GERD (gastroesophageal reflux disease)                       Arthritis                                                    HOH (hard of hearing)                                        Chronic kidney disease                                         Comment:stones   Prostate disease                                             Hypothyroidism  Reproductive/Obstetrics negative OB ROS                           Anesthesia Physical  Anesthesia Plan  ASA: III  Anesthesia Plan: General   Post-op Pain Management:    Induction: Intravenous  Airway Management Planned: Oral ETT  Additional Equipment:   Intra-op Plan:   Post-operative Plan:   Informed Consent: I have reviewed the patients History and Physical, chart, labs and discussed the procedure including the risks, benefits and alternatives for the proposed anesthesia with the patient or authorized representative who has indicated his/her understanding and acceptance.   Dental Advisory Given  Plan Discussed with: Anesthesiologist, CRNA and Surgeon  Anesthesia Plan Comments:        Anesthesia Quick Evaluation

## 2015-11-23 ENCOUNTER — Encounter: Payer: Self-pay | Admitting: *Deleted

## 2015-11-23 ENCOUNTER — Encounter
Admission: EM | Disposition: A | Discharge status: Home / Self Care | Payer: Self-pay | Source: Home / Self Care | Attending: Surgery

## 2015-11-23 ENCOUNTER — Inpatient Hospital Stay: Payer: BLUE CROSS/BLUE SHIELD | Admitting: Certified Registered Nurse Anesthetist

## 2015-11-23 DIAGNOSIS — K859 Acute pancreatitis without necrosis or infection, unspecified: Secondary | ICD-10-CM

## 2015-11-23 DIAGNOSIS — K851 Biliary acute pancreatitis without necrosis or infection: Secondary | ICD-10-CM

## 2015-11-23 DIAGNOSIS — K8050 Calculus of bile duct without cholangitis or cholecystitis without obstruction: Secondary | ICD-10-CM | POA: Insufficient documentation

## 2015-11-23 HISTORY — PX: CHOLECYSTECTOMY: SHX55

## 2015-11-23 LAB — COMPREHENSIVE METABOLIC PANEL
ALK PHOS: 104 U/L (ref 38–126)
ALT: 77 U/L — AB (ref 17–63)
AST: 31 U/L (ref 15–41)
Albumin: 3.2 g/dL — ABNORMAL LOW (ref 3.5–5.0)
Anion gap: 0 — ABNORMAL LOW (ref 5–15)
BILIRUBIN TOTAL: 1.7 mg/dL — AB (ref 0.3–1.2)
BUN: 11 mg/dL (ref 6–20)
CHLORIDE: 114 mmol/L — AB (ref 101–111)
CO2: 23 mmol/L (ref 22–32)
Calcium: 8 mg/dL — ABNORMAL LOW (ref 8.9–10.3)
Creatinine, Ser: 0.92 mg/dL (ref 0.61–1.24)
Glucose, Bld: 174 mg/dL — ABNORMAL HIGH (ref 65–99)
Potassium: 4 mmol/L (ref 3.5–5.1)
Sodium: 137 mmol/L (ref 135–145)
Total Protein: 5.9 g/dL — ABNORMAL LOW (ref 6.5–8.1)

## 2015-11-23 LAB — LIPASE, BLOOD: LIPASE: 71 U/L — AB (ref 11–51)

## 2015-11-23 SURGERY — LAPAROSCOPIC CHOLECYSTECTOMY
Anesthesia: General | Wound class: Clean Contaminated

## 2015-11-23 MED ORDER — SUCCINYLCHOLINE CHLORIDE 20 MG/ML IJ SOLN
INTRAMUSCULAR | Status: DC | PRN
  Administered 2015-11-23: 140 mg via INTRAVENOUS

## 2015-11-23 MED ORDER — LACTATED RINGERS IV SOLN
INTRAVENOUS | Status: DC | PRN
  Administered 2015-11-23 (×2): via INTRAVENOUS

## 2015-11-23 MED ORDER — FENTANYL CITRATE (PF) 100 MCG/2ML IJ SOLN
INTRAMUSCULAR | Status: DC | PRN
  Administered 2015-11-23 (×3): 50 ug via INTRAVENOUS
  Administered 2015-11-23: 100 ug via INTRAVENOUS
  Administered 2015-11-23 (×3): 50 ug via INTRAVENOUS

## 2015-11-23 MED ORDER — SODIUM CHLORIDE FLUSH 0.9 % IV SOLN
INTRAVENOUS | Status: AC
  Filled 2015-11-23: qty 10

## 2015-11-23 MED ORDER — FENTANYL CITRATE (PF) 100 MCG/2ML IJ SOLN
INTRAMUSCULAR | Status: AC
  Filled 2015-11-23: qty 2

## 2015-11-23 MED ORDER — MIDAZOLAM HCL 2 MG/2ML IJ SOLN
INTRAMUSCULAR | Status: DC | PRN
  Administered 2015-11-23: 2 mg via INTRAVENOUS

## 2015-11-23 MED ORDER — LOSARTAN POTASSIUM 50 MG PO TABS
50.0000 mg | ORAL_TABLET | Freq: Every day | ORAL | Status: DC
  Administered 2015-11-23 – 2015-11-24 (×2): 50 mg via ORAL
  Filled 2015-11-23 (×2): qty 1

## 2015-11-23 MED ORDER — ACETAMINOPHEN 10 MG/ML IV SOLN
INTRAVENOUS | Status: AC
  Filled 2015-11-23: qty 100

## 2015-11-23 MED ORDER — MORPHINE SULFATE (PF) 2 MG/ML IV SOLN: 2.0000 mg | INTRAVENOUS | Status: DC | PRN

## 2015-11-23 MED ORDER — LEVOTHYROXINE SODIUM 50 MCG PO TABS
50.0000 ug | ORAL_TABLET | Freq: Every day | ORAL | Status: DC
  Administered 2015-11-24: 50 ug via ORAL
  Filled 2015-11-23: qty 1

## 2015-11-23 MED ORDER — DEXAMETHASONE SODIUM PHOSPHATE 10 MG/ML IJ SOLN
INTRAMUSCULAR | Status: DC | PRN
Start: 2015-11-23 — End: 2015-11-23
  Administered 2015-11-23: 10 mg via INTRAVENOUS

## 2015-11-23 MED ORDER — FENTANYL CITRATE (PF) 100 MCG/2ML IJ SOLN
25.0000 ug | INTRAMUSCULAR | Status: DC | PRN
Start: 2015-11-23 — End: 2015-11-23
  Administered 2015-11-23 (×4): 25 ug via INTRAVENOUS

## 2015-11-23 MED ORDER — SUGAMMADEX SODIUM 200 MG/2ML IV SOLN
INTRAVENOUS | Status: DC | PRN
  Administered 2015-11-23: 226.8 mg via INTRAVENOUS

## 2015-11-23 MED ORDER — ACETAMINOPHEN 10 MG/ML IV SOLN
INTRAVENOUS | Status: DC | PRN
  Administered 2015-11-23: 1000 mg via INTRAVENOUS

## 2015-11-23 MED ORDER — PHENYLEPHRINE HCL 10 MG/ML IJ SOLN
INTRAMUSCULAR | Status: DC | PRN
  Administered 2015-11-23: 100 ug via INTRAVENOUS

## 2015-11-23 MED ORDER — OXYCODONE-ACETAMINOPHEN 7.5-325 MG PO TABS
1.0000 | ORAL_TABLET | ORAL | Status: DC | PRN
  Administered 2015-11-23: 2 via ORAL
  Filled 2015-11-23: qty 2

## 2015-11-23 MED ORDER — PROPOFOL 10 MG/ML IV BOLUS
INTRAVENOUS | Status: DC | PRN
  Administered 2015-11-23: 200 mg via INTRAVENOUS

## 2015-11-23 MED ORDER — ONDANSETRON HCL 4 MG/2ML IJ SOLN: 4.0000 mg | Freq: Once | INTRAMUSCULAR | Status: DC | PRN

## 2015-11-23 MED ORDER — BUPIVACAINE HCL (PF) 0.25 % IJ SOLN
INTRAMUSCULAR | Status: AC
  Filled 2015-11-23: qty 30

## 2015-11-23 MED ORDER — ASPIRIN EC 81 MG PO TBEC
81.0000 mg | DELAYED_RELEASE_TABLET | Freq: Every day | ORAL | Status: DC
  Administered 2015-11-23 – 2015-11-24 (×2): 81 mg via ORAL
  Filled 2015-11-23 (×2): qty 1

## 2015-11-23 MED ORDER — ROCURONIUM BROMIDE 100 MG/10ML IV SOLN
INTRAVENOUS | Status: DC | PRN
  Administered 2015-11-23: 45 mg via INTRAVENOUS
  Administered 2015-11-23: 30 mg via INTRAVENOUS
  Administered 2015-11-23: 5 mg via INTRAVENOUS

## 2015-11-23 MED ORDER — LIDOCAINE HCL (CARDIAC) 20 MG/ML IV SOLN
INTRAVENOUS | Status: DC | PRN
  Administered 2015-11-23: 80 mg via INTRAVENOUS

## 2015-11-23 MED ORDER — CYCLOBENZAPRINE HCL 10 MG PO TABS
10.0000 mg | ORAL_TABLET | Freq: Three times a day (TID) | ORAL | Status: DC
  Administered 2015-11-23 – 2015-11-24 (×2): 10 mg via ORAL
  Filled 2015-11-23 (×3): qty 1

## 2015-11-23 SURGICAL SUPPLY — 43 items
APPLIER CLIP 5 13 M/L LIGAMAX5 (MISCELLANEOUS) ×2
BLADE SURG 15 STRL LF DISP TIS (BLADE) ×1 IMPLANT
BLADE SURG 15 STRL SS (BLADE) ×1
CANISTER SUCT 1200ML W/VALVE (MISCELLANEOUS) ×2 IMPLANT
CATH CHOLANGI 4FR 420404F (CATHETERS) IMPLANT
CHLORAPREP W/TINT 26ML (MISCELLANEOUS) ×2 IMPLANT
CLIP APPLIE 5 13 M/L LIGAMAX5 (MISCELLANEOUS) ×1 IMPLANT
CONRAY 60ML FOR OR (MISCELLANEOUS) ×2 IMPLANT
DEFOGGER SCOPE WARMER CLEARIFY (MISCELLANEOUS) ×2 IMPLANT
ELECT CAUTERY BLADE 6.4 (BLADE) IMPLANT
ELECT E-Z MONOPOLAR 33 (MISCELLANEOUS) ×2
ELECT REM PT RETURN 9FT ADLT (ELECTROSURGICAL) ×2
ELECTRODE E-Z MONOPOLAR 33 (MISCELLANEOUS) ×1 IMPLANT
ELECTRODE REM PT RTRN 9FT ADLT (ELECTROSURGICAL) ×1 IMPLANT
ENDOPOUCH RETRIEVER 10 (MISCELLANEOUS) ×2 IMPLANT
GLOVE BIO SURGEON STRL SZ7 (GLOVE) ×2 IMPLANT
GLOVE BIOGEL PI IND STRL 6.5 (GLOVE) ×1 IMPLANT
GLOVE BIOGEL PI INDICATOR 6.5 (GLOVE) ×1
GLOVE EXAM NITRILE PF MED BLUE (GLOVE) ×2 IMPLANT
GLOVE PI ORTHOPRO 6.5 (GLOVE) ×1
GLOVE PI ORTHOPRO STRL 6.5 (GLOVE) ×1 IMPLANT
GOWN STRL REUS W/ TWL LRG LVL3 (GOWN DISPOSABLE) ×3 IMPLANT
GOWN STRL REUS W/TWL LRG LVL3 (GOWN DISPOSABLE) ×3
IRRIGATION STRYKERFLOW (MISCELLANEOUS) ×1 IMPLANT
IRRIGATOR STRYKERFLOW (MISCELLANEOUS) ×2
IV CATH ANGIO 12GX3 LT BLUE (NEEDLE) ×2 IMPLANT
IV NS 1000ML (IV SOLUTION) ×1
IV NS 1000ML BAXH (IV SOLUTION) ×1 IMPLANT
LABEL OR SOLS (LABEL) ×2 IMPLANT
LIQUID BAND (GAUZE/BANDAGES/DRESSINGS) ×2 IMPLANT
NEEDLE HYPO 25X1 1.5 SAFETY (NEEDLE) ×2 IMPLANT
NS IRRIG 500ML POUR BTL (IV SOLUTION) ×2 IMPLANT
PACK LAP CHOLECYSTECTOMY (MISCELLANEOUS) ×2 IMPLANT
PENCIL ELECTRO HAND CTR (MISCELLANEOUS) ×2 IMPLANT
SCISSORS METZENBAUM CVD 33 (INSTRUMENTS) ×2 IMPLANT
SLEEVE ENDOPATH XCEL 5M (ENDOMECHANICALS) ×4 IMPLANT
SUT MNCRL 4-0 (SUTURE) ×1
SUT MNCRL 4-0 27XMFL (SUTURE) ×1
SUT VICRYL 0 AB UR-6 (SUTURE) ×4 IMPLANT
SUTURE MNCRL 4-0 27XMF (SUTURE) ×1 IMPLANT
TROCAR XCEL BLUNT TIP 100MML (ENDOMECHANICALS) ×2 IMPLANT
TROCAR XCEL NON-BLD 5MMX100MML (ENDOMECHANICALS) ×2 IMPLANT
TUBING INSUFFLATOR HI FLOW (MISCELLANEOUS) ×2 IMPLANT

## 2015-11-23 NOTE — Op Note (Signed)
Laparoscopic Cholecystectomy Procedure Note  Indications: This patient presents with symptomatic gallbladder disease and will undergo laparoscopic cholecystectomy.  Pre-operative Diagnosis: Calculus of bile duct with acute cholecystitis and obstruction  Post-operative Diagnosis: Same  Surgeon: Gladis Riffleatherine L Ogden Handlin   Assistants: none  Anesthesia: General endotracheal anesthesia  ASA Class: 3  Procedure Details  The patient was seen again in the Holding Room. The risks, benefits, complications, treatment options, and expected outcomes were discussed with the patient. The possibilities of reaction to medication, pulmonary aspiration, perforation of viscus, bleeding, recurrent infection, finding a normal gallbladder, the need for additional procedures, failure to diagnose a condition, the possible need to convert to an open procedure, and creating a complication requiring transfusion or operation were discussed with the patient. The patient and/or family concurred with the proposed plan, giving informed consent. The site of surgery properly noted/marked. The patient was taken to Operating Room, identified as [name] [name] and the procedure verified as Laparoscopic Cholecystectomy. A Time Out was held and the above information confirmed.  Prior to the induction of general anesthesia, antibiotic prophylaxis was administered. General endotracheal anesthesia was then administered and tolerated well. After the induction, the abdomen was prepped in the usual sterile fashion. The patient was positioned in the supine position with the left arm comfortably tucked, along with some reverse Trendelenburg.  Local anesthetic agent was injected into the skin near the umbilicus and an incision made. The midline fascia was incised and the Hasson technique was used to introduce a 10 mm port under direct vision. It was secured with two figure of eight Vicryl sutures placed in the usual fashion. Pneumoperitoneum was then  created with CO2 and tolerated well without any adverse changes in the patient's vital signs. Additional trocars were introduced under direct vision. All skin incisions were infiltrated with a local anesthetic agent before making the incision and placing the trocars.   The gallbladder was identified, the fundus grasped and retracted cephalad. Adhesions were lysed bluntly. The infundibulum was grasped and retracted laterally, exposing the peritoneum overlying the triangle of Calot. This was then divided and exposed in a blunt fashion. The cystic duct was clearly identified and bluntly dissected circumferentially. The junctions of the gallbladder, cystic duct and common bile duct were clearly identified prior to the division of any linear structure.   The cystic duct was then doubly ligated with surgical clips on the patient side and singly clipped on the gallbladder side and divided. The cystic artery was identified, dissected free, ligated with clips and divided as well.   The gallbladder was dissected from the liver bed in retrograde fashion with the electrocautery. The gallbladder was removed. The liver bed was irrigated and inspected. Hemostasis was achieved with the electrocautery. Copious irrigation was utilized and was repeatedly aspirated until clear all particulate matter.  Pneumoperitoneum was completely reduced after viewing removal of the trocars under direct vision. The wound was thoroughly irrigated and the fascia was then closed with a figure of eight suture; the skin was then closed with 4-0 Monocryl and a sterile glue was applied.  Instrument, sponge, and needle counts were correct at closure and at the conclusion of the case.   Findings: Cholecystitis with Cholelithiasis  Estimated Blood Loss: less than 100 mL         Drains: none         Total IV Fluids: 1000mL         Specimens: Gallbladder           Complications: None;  patient tolerated the procedure well.          Disposition: PACU - hemodynamically stable.         Condition: stable

## 2015-11-23 NOTE — Transfer of Care (Signed)
Immediate Anesthesia Transfer of Care Note  Patient: [name] [name]  Procedure(s) Performed: Procedure(s): LAPAROSCOPIC CHOLECYSTECTOMY (N/A)  Patient Location: PACU  Anesthesia Type:General  Level of Consciousness: sedated  Airway & Oxygen Therapy: Patient Spontanous Breathing and Patient connected to face mask oxygen  Post-op Assessment: Report given to RN and Post -op Vital signs reviewed and stable  Post vital signs: Reviewed and stable  Last Vitals:  Filed Vitals:   11/23/15 0745 11/23/15 1139  BP: 128/71 137/88  Pulse: 74 65  Temp: 36.7 C 37.4 C  Resp: 16 16    Complications: No apparent anesthesia complications

## 2015-11-23 NOTE — Anesthesia Procedure Notes (Signed)
Procedure Name: Intubation Date/Time: 11/23/2015 2:45 PM Performed by: Ginger CarneMICHELET, Letitia Sabala Pre-anesthesia Checklist: Patient identified, Emergency Drugs available, Suction available, Patient being monitored and Timeout performed Patient Re-evaluated:Patient Re-evaluated prior to inductionOxygen Delivery Method: Circle system utilized Preoxygenation: Pre-oxygenation with 100% oxygen Intubation Type: IV induction Ventilation: Mask ventilation without difficulty, Two handed mask ventilation required and Oral airway inserted - appropriate to patient size Laryngoscope Size: Hyacinth MeekerMiller and 2 Grade View: Grade I Tube type: Oral Tube size: 7.5 mm Number of attempts: 1 Airway Equipment and Method: Stylet Placement Confirmation: ETT inserted through vocal cords under direct vision,  positive ETCO2 and breath sounds checked- equal and bilateral Secured at: 22 cm Tube secured with: Tape

## 2015-11-23 NOTE — Anesthesia Preprocedure Evaluation (Signed)
Anesthesia Evaluation  Patient identified by MRN, date of birth, ID band Patient awake    Reviewed: Allergy & Precautions, NPO status , Patient's Chart, lab work & pertinent test results  History of Anesthesia Complications Negative for: history of anesthetic complications  Airway Mallampati: II       Dental  (+) Caps   Pulmonary neg pulmonary ROS, sleep apnea and Continuous Positive Airway Pressure Ventilation ,           Cardiovascular hypertension, Pt. on medications + dysrhythmias (skipped beats)      Neuro/Psych negative neurological ROS     GI/Hepatic Neg liver ROS, GERD  Medicated and Poorly Controlled,  Endo/Other  Hypothyroidism   Renal/GU Renal disease (stones)     Musculoskeletal  (+) Arthritis , Osteoarthritis,    Abdominal   Peds  Hematology negative hematology ROS (+)   Anesthesia Other Findings   Reproductive/Obstetrics                             Anesthesia Physical Anesthesia Plan  ASA: II  Anesthesia Plan: General   Post-op Pain Management:    Induction: Intravenous  Airway Management Planned: Oral ETT  Additional Equipment:   Intra-op Plan:   Post-operative Plan:   Informed Consent: I have reviewed the patients History and Physical, chart, labs and discussed the procedure including the risks, benefits and alternatives for the proposed anesthesia with the patient or authorized representative who has indicated his/her understanding and acceptance.     Plan Discussed with:   Anesthesia Plan Comments:         Anesthesia Quick Evaluation

## 2015-11-23 NOTE — Interval H&P Note (Signed)
History and Physical Interval Note:  11/23/2015 2:42 PM  [name] [name]  has presented today for surgery, with the diagnosis of cholecystitis  The various methods of treatment have been discussed with the patient and family. After consideration of risks, benefits and other options for treatment, the patient has consented to  Procedure(s): LAPAROSCOPIC CHOLECYSTECTOMY (N/A) as a surgical intervention .  The patient's history has been reviewed, patient examined, no change in status, stable for surgery.  I have reviewed the patient's chart and labs.  Questions were answered to the patient's satisfaction.     Svara Twyman L Izan Miron

## 2015-11-23 NOTE — Anesthesia Postprocedure Evaluation (Signed)
Anesthesia Post Note  Patient: [name] [name]  Procedure(s) Performed: Procedure(s) (LRB): LAPAROSCOPIC CHOLECYSTECTOMY (N/A)  Patient location during evaluation: PACU Anesthesia Type: General Level of consciousness: awake and alert and oriented Pain management: pain level controlled Vital Signs Assessment: post-procedure vital signs reviewed and stable Respiratory status: spontaneous breathing Cardiovascular status: blood pressure returned to baseline Anesthetic complications: no    Last Vitals:  Filed Vitals:   11/23/15 1826 11/23/15 1949  BP: 138/77 123/77  Pulse: 86 69  Temp: 36.4 C 37.1 C  Resp: 16 18    Last Pain:  Filed Vitals:   11/23/15 1950  PainSc: 4                  Raeleigh Guinn

## 2015-11-24 ENCOUNTER — Encounter: Payer: Self-pay | Admitting: Surgery

## 2015-11-24 MED ORDER — OXYCODONE-ACETAMINOPHEN 7.5-325 MG PO TABS: 1.0000 | ORAL_TABLET | ORAL | Status: DC | PRN

## 2015-11-24 NOTE — Progress Notes (Signed)
1 Day Post-Op  Subjective: Patient feels well today and wants to go home. He is tolerating a diet.  Objective: Vital signs in last 24 hours: Temp:  [97.5 F (36.4 C)-99.4 F (37.4 C)] 97.6 F (36.4 C) (03/29 0504) Pulse Rate:  [51-91] 51 (03/29 0504) Resp:  [10-18] 18 (03/29 0504) BP: (123-162)/(77-97) 123/85 mmHg (03/29 0504) SpO2:  [94 %-100 %] 100 % (03/29 0504) Last BM Date: 11/20/15  Intake/Output from previous day: 03/28 0701 - 03/29 0700 In: 3967 [P.O.:540; I.V.:3127; IV Piggyback:300] Out: 1050 [Urine:1025; Blood:25] Intake/Output this shift: Total I/O In: 927.5 [P.O.:240; I.V.:687.5] Out: -   Physical exam:  No icterus no jaundice abdomen is soft nontender wounds are clean no erythema or drainage calves are nontender  Lab Results: CBC   Recent Labs  11/22/15 0522  WBC 5.2  HGB 12.6*  HCT 37.5*  PLT 177   BMET  Recent Labs  11/22/15 0522 11/23/15 0403  NA 139 137  K 3.6 4.0  CL 111 114*  CO2 25 23  GLUCOSE 108* 174*  BUN 15 11  CREATININE 0.88 0.92  CALCIUM 7.9* 8.0*   PT/INR No results for input(s): LABPROT, INR in the last 72 hours. ABG No results for input(s): PHART, HCO3 in the last 72 hours.  Invalid input(s): PCO2, PO2  Studies/Results: No results found.  Anti-infectives: Anti-infectives    Start     Dose/Rate Route Frequency Ordered Stop   11/21/15 0345  metroNIDAZOLE (FLAGYL) IVPB 500 mg     500 mg 100 mL/hr over 60 Minutes Intravenous Every 8 hours 11/21/15 0334     11/21/15 0345  ciprofloxacin (CIPRO) IVPB 400 mg    Comments:  Advanced time to start ~24 hours after Levaquin.   400 mg 200 mL/hr over 60 Minutes Intravenous Every 12 hours 11/21/15 0334     11/21/15 0300  metroNIDAZOLE (FLAGYL) IVPB 500 mg     500 mg 100 mL/hr over 60 Minutes Intravenous  Once 11/21/15 0247 11/21/15 0412   11/21/15 0300  levofloxacin (LEVAQUIN) IVPB 750 mg     750 mg 100 mL/hr over 90 Minutes Intravenous  Once 11/21/15 0247 11/21/15 0452      Assessment/Plan: s/p Procedure(s): LAPAROSCOPIC CHOLECYSTECTOMY   Labs are improved recommended discharge today in follow-up in 10 days.[name] [name]*  [name] Yaklin E Buffi Ewton, MD, FACS  11/24/2015

## 2015-11-24 NOTE — Discharge Instructions (Signed)
°  May shower in 24 hours.  Resume all home medications. Follow-up with Dr. Everlene FarrierPabon in 10 days.

## 2015-11-24 NOTE — Progress Notes (Signed)
Pt to be discharged per MD order. Iv removed. Scripts given to pt. All instructions reviewed with pt and wife, all questions answered.

## 2015-11-25 LAB — SURGICAL PATHOLOGY

## 2015-11-25 NOTE — Discharge Summary (Signed)
1 Day Post-Op  Subjective: Patient feels well today and wants to go home. He is tolerating a diet.  Objective: Vital signs in last 24 hours: Temp: [97.5 F (36.4 C)-99.4 F (37.4 C)] 97.6 F (36.4 C) (03/29 0504) Pulse Rate: [51-91] 51 (03/29 0504) Resp: [10-18] 18 (03/29 0504) BP: (123-162)/(77-97) 123/85 mmHg (03/29 0504) SpO2: [94 %-100 %] 100 % (03/29 0504) Last BM Date: 11/20/15  Intake/Output from previous day: 03/28 0701 - 03/29 0700 In: 3967 [P.O.:540; I.V.:3127; IV Piggyback:300] Out: 1050 [Urine:1025; Blood:25] Intake/Output this shift: Total I/O In: 927.5 [P.O.:240; I.V.:687.5] Out: -   Physical exam:  No icterus no jaundice abdomen is soft nontender wounds are clean no erythema or drainage calves are nontender  Lab Results: CBC   Recent Labs (last 2 labs)      Recent Labs  11/22/15 0522  WBC 5.2  HGB 12.6*  HCT 37.5*  PLT 177     BMET  Recent Labs (last 2 labs)      Recent Labs  11/22/15 0522 11/23/15 0403  NA 139 137  K 3.6 4.0  CL 111 114*  CO2 25 23  GLUCOSE 108* 174*  BUN 15 11  CREATININE 0.88 0.92  CALCIUM 7.9* 8.0*     PT/INR  Recent Labs (last 2 labs)     No results for input(s): LABPROT, INR in the last 72 hours.   ABG  Recent Labs (last 2 labs)     No results for input(s): PHART, HCO3 in the last 72 hours.  Invalid input(s): PCO2, PO2    Studies/Results:  Imaging Results (Last 48 hours)    No results found.    Anti-infectives: Anti-infectives    Start   Dose/Rate Route Frequency Ordered Stop   11/21/15 0345  metroNIDAZOLE (FLAGYL) IVPB 500 mg    500 mg 100 mL/hr over 60 Minutes Intravenous Every 8 hours 11/21/15 0334    11/21/15 0345  ciprofloxacin (CIPRO) IVPB 400 mg   Comments: Advanced time to start ~24 hours after Levaquin.   400 mg 200 mL/hr over 60 Minutes Intravenous Every 12 hours 11/21/15 0334    11/21/15 0300   metroNIDAZOLE (FLAGYL) IVPB 500 mg    500 mg 100 mL/hr over 60 Minutes Intravenous Once 11/21/15 0247 11/21/15 0412   11/21/15 0300  levofloxacin (LEVAQUIN) IVPB 750 mg    750 mg 100 mL/hr over 90 Minutes Intravenous Once 11/21/15 0247 11/21/15 0452      Assessment/Plan: s/p Procedure(s): LAPAROSCOPIC CHOLECYSTECTOMY   Labs are improved recommended discharge today in follow-up in 10 days.Lattie Haw*  Richard E Cooper, MD, FACS  11/24/2015

## 2015-11-26 LAB — CULTURE, BLOOD (ROUTINE X 2)
CULTURE: NO GROWTH
Culture: NO GROWTH

## 2015-12-08 ENCOUNTER — Encounter: Payer: Self-pay | Admitting: Surgery

## 2015-12-08 ENCOUNTER — Ambulatory Visit (INDEPENDENT_AMBULATORY_CARE_PROVIDER_SITE_OTHER): Payer: BLUE CROSS/BLUE SHIELD | Admitting: Surgery

## 2015-12-08 VITALS — BP 123/81 | HR 84 | Temp 98.3°F | Wt 253.0 lb

## 2015-12-08 DIAGNOSIS — Z09 Encounter for follow-up examination after completed treatment for conditions other than malignant neoplasm: Secondary | ICD-10-CM

## 2015-12-08 NOTE — Progress Notes (Signed)
S/p ERCP and Lap chole by Dr. Orvis Brillloflin 3/28. Doing very well, no complaints + PO, no pain  PE NAD, ambulating Abd: soft, NT, incisions c/d/i , no infection  A/P doing well  RTC prn  No heavy lifitng

## 2015-12-17 ENCOUNTER — Encounter: Payer: Self-pay | Admitting: Family Medicine

## 2015-12-17 ENCOUNTER — Ambulatory Visit (INDEPENDENT_AMBULATORY_CARE_PROVIDER_SITE_OTHER): Payer: BLUE CROSS/BLUE SHIELD | Admitting: Family Medicine

## 2015-12-17 VITALS — BP 110/80 | HR 100 | Temp 98.7°F | Resp 16 | Ht 74.0 in | Wt 254.0 lb

## 2015-12-17 DIAGNOSIS — Z0000 Encounter for general adult medical examination without abnormal findings: Secondary | ICD-10-CM | POA: Diagnosis not present

## 2015-12-17 DIAGNOSIS — R739 Hyperglycemia, unspecified: Secondary | ICD-10-CM

## 2015-12-17 DIAGNOSIS — E039 Hypothyroidism, unspecified: Secondary | ICD-10-CM

## 2015-12-17 DIAGNOSIS — E669 Obesity, unspecified: Secondary | ICD-10-CM | POA: Diagnosis not present

## 2015-12-17 DIAGNOSIS — R5383 Other fatigue: Secondary | ICD-10-CM

## 2015-12-17 DIAGNOSIS — I10 Essential (primary) hypertension: Secondary | ICD-10-CM | POA: Diagnosis not present

## 2015-12-17 DIAGNOSIS — E785 Hyperlipidemia, unspecified: Secondary | ICD-10-CM

## 2015-12-17 DIAGNOSIS — G4733 Obstructive sleep apnea (adult) (pediatric): Secondary | ICD-10-CM | POA: Diagnosis not present

## 2015-12-17 NOTE — Patient Instructions (Signed)
It is recommended to engage in 150 minutes of vigorous exercise every week.  

## 2015-12-17 NOTE — Progress Notes (Signed)
Patient: [name] EatonDonald Bogacki Male    DOB: [birth year]   65 y.o.   MRN: [id number] Visit Date: 12/17/2015  Today's Provider: Mila Merryonald Giannamarie Paulus, MD   Chief Complaint  Patient presents with  . Annual Exam  . Hypertension  . Hyperlipidemia  . Hypothyroidism  . Sleep Apnea   Subjective:    HPI   Yearly physical Present for yearly physical and health maintenance. Is feeling well. He had gallbladder attack last month after being treated for pneumonia, and had lap cholecystectomy which has fully recovered from. He states he does feel fatigued and wants his B12 level checked.   Follow-up for hypothyroidism from 04/13/2015; no changes.  Follow-up for OSA from 04/13/2015; no changes, continue CPAP usage. States he is using his CPAP consistently every night and does not feel sleepy during the day.    Hypertension, follow-up:  BP Readings from Last 3 Encounters:  12/17/15 110/80  12/08/15 123/81  11/24/15 123/85    He was last seen for hypertension 1 years ago.  BP at that visit was 136/82. Management since that visit includes; no changes.He reports good compliance with treatment. He is not having side effects. none  He is exercising. He is adherent to low salt diet.   Outside blood pressures are 120/80. He is experiencing none.  Patient denies none.   Cardiovascular risk factors include none.  Use of agents associated with hypertension: none.   ------------------------------------------------------------------------    Lipid/Cholesterol, Follow-up:   Last seen for this 1 years ago.  Management since that visit includes; recommended to avoid saturated fats and take aspirin qd.  Last Lipid Panel:    Component Value Date/Time   CHOL 206* 01/05/2015   TRIG 118 01/05/2015   HDL 43 01/05/2015   LDLCALC 139 01/05/2015    He reports good compliance with treatment. He is not having side effects. none  Wt Readings from Last 3 Encounters:  12/17/15 254 lb (115.214 kg)    12/08/15 253 lb (114.76 kg)  11/20/15 250 lb (113.399 kg)    ----------------------------------------------------------------------     Allergies  Allergen Reactions  . Fentanyl Itching  . Crestor  [Rosuvastatin Calcium]     Other reaction(s): Muscle Pain (severe)  . Ibuprofen   . Penicillins    Previous Medications   ASPIRIN 81 MG TABLET    Take 81 mg by mouth daily.   DOCUSATE SODIUM (COLACE) 100 MG CAPSULE    Take 100 mg by mouth daily.    LEVOTHYROXINE (SYNTHROID, LEVOTHROID) 50 MCG TABLET    TAKE 1 TABLET BY MOUTH EVERY DAY   LOSARTAN (COZAAR) 50 MG TABLET    Take 1 tablet (50 mg total) by mouth daily.   MULTIPLE VITAMINS-MINERALS (MULTIVITAMIN ADULT PO)    Take 1 tablet by mouth daily.   RANITIDINE (ZANTAC) 150 MG TABLET    Take 150 mg by mouth daily.   TADALAFIL (CIALIS) 5 MG TABLET    Take 1 tablet (5 mg total) by mouth daily.    Review of Systems  Constitutional: Positive for fatigue.  HENT: Negative.   Eyes: Negative.   Respiratory: Negative.   Cardiovascular: Negative.   Gastrointestinal: Negative.   Endocrine: Positive for cold intolerance.  Genitourinary: Negative.   Musculoskeletal: Negative.   Skin: Negative.   Allergic/Immunologic: Negative.   Neurological: Negative.   Hematological: Negative.   Psychiatric/Behavioral: Negative.     Social History  Substance Use Topics  . Smoking status: Never Smoker   . Smokeless tobacco:  Not on file  . Alcohol Use: 0.0 oz/week    0 Standard drinks or equivalent per week     Comment: moderate use; 8-9 drinks a week   Objective:   BP 110/80 mmHg  Pulse 100  Temp(Src) 98.7 F (37.1 C) (Oral)  Resp 16  Ht  (1.88 m)  Wt 254 lb (115.214 kg)  BMI 32.60 kg/m2  SpO2 95%  Physical Exam   General Appearance:    Alert, cooperative, no distress, appears stated age, overweight  Head:    Normocephalic, without obvious abnormality, atraumatic  Eyes:    PERRL, conjunctiva/corneas clear, EOM's intact, fundi     benign, both eyes       Ears:    Normal TM's and external ear canals, both ears  Nose:   Nares normal, septum midline, mucosa normal, no drainage   or sinus tenderness  Throat:   Lips, mucosa, and tongue normal; teeth and gums normal  Neck:   Supple, symmetrical, trachea midline, no adenopathy;       thyroid:  No enlargement/tenderness/nodules; no carotid   bruit or JVD  Back:     Symmetric, no curvature, ROM normal, no CVA tenderness  Lungs:     Clear to auscultation bilaterally, respirations unlabored  Chest wall:    No tenderness or deformity  Heart:    Regular rate and rhythm, S1 and S2 normal, no murmur, rub   or gallop  Abdomen:     Soft, non-tender, bowel sounds active all four quadrants,    no masses, no organomegaly  Genitalia:    deferred  Rectal:    deferred  Extremities:   Extremities normal, atraumatic, no cyanosis or edema  Pulses:   2+ and symmetric all extremities  Skin:   Skin color, texture, turgor normal, no rashes or lesions  Lymph nodes:   Cervical, supraclavicular, and axillary nodes normal  Neurologic:   CNII-XII intact. Normal strength, sensation and reflexes      throughout       Assessment & Plan:     1. Annual physical exam Generally doing well.   2. Essential hypertension Well controlled.  Continue current medications.   - Lipid panel  3. Hyperlipidemia Diet controlled.   4. Hypothyroidism, unspecified hypothyroidism type  - TSH  5. Other fatigue  - Hemoglobin A1c - Vitamin B12  6. Obesity Diet and exercise.   7. Obstructive sleep apnea Compliant with CPAP which is effectively controlling symptoms.   8. Hyperglycemia GLUCOSE  Date Value Ref Range Status  05/18/2014 102* 65-99 mg/dL Final   GLUCOSE, BLD  Date Value Ref Range Status  11/23/2015 174* 65 - 99 mg/dL Final    - Hemoglobin G9F       Mila Merry, MD  [facility]

## 2015-12-28 ENCOUNTER — Telehealth: Payer: Self-pay | Admitting: Radiology

## 2015-12-28 ENCOUNTER — Other Ambulatory Visit: Payer: Self-pay

## 2015-12-28 DIAGNOSIS — N400 Enlarged prostate without lower urinary tract symptoms: Secondary | ICD-10-CM

## 2015-12-28 MED ORDER — TADALAFIL 5 MG PO TABS
5.0000 mg | ORAL_TABLET | Freq: Every day | ORAL | Status: DC
Start: 2015-12-28 — End: 2016-03-08

## 2015-12-28 NOTE — Telephone Encounter (Signed)
Pt requests refill for cialis. Per Leeroy Bockhelsea we haven't received a request & ask pt to have pharmacy send a request. Pt voices understanding.

## 2015-12-29 LAB — LIPID PANEL
CHOL/HDL RATIO: 4.3 ratio (ref 0.0–5.0)
Cholesterol, Total: 220 mg/dL — ABNORMAL HIGH (ref 100–199)
HDL: 51 mg/dL (ref >39)
LDL CALC: 143 mg/dL — AB (ref 0–99)
Triglycerides: 130 mg/dL (ref 0–149)
VLDL CHOLESTEROL CAL: 26 mg/dL (ref 5–40)

## 2015-12-29 LAB — TSH: TSH: 3.88 u[IU]/mL (ref 0.450–4.500)

## 2015-12-29 LAB — HEMOGLOBIN A1C
Est. average glucose Bld gHb Est-mCnc: 117 mg/dL
Hgb A1c MFr Bld: 5.7 % — ABNORMAL HIGH (ref 4.8–5.6)

## 2015-12-29 LAB — VITAMIN B12: VITAMIN B 12: 335 pg/mL (ref 211–946)

## 2016-02-04 ENCOUNTER — Ambulatory Visit: Payer: BLUE CROSS/BLUE SHIELD | Admitting: Urology

## 2016-03-01 ENCOUNTER — Telehealth: Payer: Self-pay | Admitting: Urology

## 2016-03-01 NOTE — Telephone Encounter (Signed)
[name] [name] called and [facility] to know [facility]  Please give him a call.

## 2016-03-06 ENCOUNTER — Other Ambulatory Visit: Payer: Self-pay

## 2016-03-06 MED ORDER — LEVOTHYROXINE SODIUM 50 MCG PO TABS: 50.0000 ug | ORAL_TABLET | Freq: Every day | ORAL | Status: DC

## 2016-03-06 NOTE — Telephone Encounter (Signed)
Pharmacy faxed refill request for this medication

## 2016-03-07 ENCOUNTER — Ambulatory Visit
Admission: RE | Admit: 2016-03-07 | Discharge: 2016-03-07 | Disposition: A | Discharge status: Home / Self Care | Payer: BLUE CROSS/BLUE SHIELD | Source: Ambulatory Visit | Attending: Urology | Admitting: Urology

## 2016-03-07 DIAGNOSIS — N200 Calculus of kidney: Secondary | ICD-10-CM | POA: Diagnosis present

## 2016-03-07 NOTE — Telephone Encounter (Signed)
PA was complete.

## 2016-03-08 ENCOUNTER — Ambulatory Visit (INDEPENDENT_AMBULATORY_CARE_PROVIDER_SITE_OTHER): Payer: BLUE CROSS/BLUE SHIELD | Admitting: Urology

## 2016-03-08 ENCOUNTER — Encounter: Payer: Self-pay | Admitting: Urology

## 2016-03-08 VITALS — BP 151/91 | HR 87 | Ht 74.0 in | Wt 262.0 lb

## 2016-03-08 DIAGNOSIS — Q6100 Congenital renal cyst, unspecified: Secondary | ICD-10-CM

## 2016-03-08 DIAGNOSIS — R972 Elevated prostate specific antigen [PSA]: Secondary | ICD-10-CM | POA: Diagnosis not present

## 2016-03-08 DIAGNOSIS — N200 Calculus of kidney: Secondary | ICD-10-CM | POA: Diagnosis not present

## 2016-03-08 DIAGNOSIS — N400 Enlarged prostate without lower urinary tract symptoms: Secondary | ICD-10-CM

## 2016-03-08 DIAGNOSIS — N281 Cyst of kidney, acquired: Secondary | ICD-10-CM

## 2016-03-08 MED ORDER — SILDENAFIL CITRATE 100 MG PO TABS: 100.0000 mg | ORAL_TABLET | Freq: Every day | ORAL | Status: DC | PRN

## 2016-03-08 NOTE — Progress Notes (Signed)
10:17 AM  03/08/2016   [name] [name] 03/06/1951 [id number]  Referring provider: Malva Limesonald E Fisher, MD [address] [place], [place] [id number]  Chief Complaint  Patient presents with  . Elevated PSA    80month w/PSA&KUB    HPI: 65 year old gentleman   Kidney stones Nephrolithiasis and obstructing stone in 2015 requiring ureteroscopy. He also 24 urinalysis done last year that showed hypocitraturia and low urine output. He was recommended to start potassium citrate at that time, but he did not want to start a new medication.  2 mm nonobstructing left renal stone on KUB 05/2015  KUB today shows snow stone.  No flank pain recently.     Erectile dysfunction He has a history of erectile dysfunction. Cialis 5 mg seems work very well for him but having acid reflux.    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  BPH / history of elevated PSA PSA has risen from 4.4-5.4 and most recently 6.6 on 07/14/2015 Bx 08/05/16 consistent with chronic inflammation TRUS volume 93 g  Voiding issues well controlled.  Nocturia x 1.  Has tried flomax in the past with ejaculatory issues. When off Cialis x 8 days, noticed no difference in urinary symptoms.      PMH: Past Medical History  Diagnosis Date  . Sleep apnea   . Dysrhythmia   . Hypertension   . GERD (gastroesophageal reflux disease)   . Arthritis   . HOH (hard of hearing)   . Chronic kidney disease     stones  . Prostate disease   . Hypothyroidism   . Acute gallstone pancreatitis     Surgical History: Past Surgical History  Procedure Laterality Date  . Arm skin lesion biopsy / excision    . Kidney stone surgery    . Colonoscopy    . Incision and drainage      anal infection  . Cataract extraction w/phaco Right 02/23/2015    Procedure: CATARACT EXTRACTION PHACO AND INTRAOCULAR LENS PLACEMENT (IOC);  Surgeon: Galen ManilaWilliam Porfilio, MD;  Location: [facility];  Service: Ophthalmology;   Laterality: Right;  US 01:01 AP%23.5 CDE 14.49 fluid pack lot #[id number]  . Cataract extraction w/phaco Left 03/30/2015    Procedure: CATARACT EXTRACTION PHACO AND INTRAOCULAR LENS PLACEMENT (IOC);  Surgeon: Galen ManilaWilliam Porfilio, MD;  Location: [facility];  Service: Ophthalmology;  Laterality: Left;  US: 01:08.3 AP%: 22.1 CDE: 15.12  Fluid Lot # [id number]  . Right ureterolithiasis with stent placement  04/29/2014    Dr. Sheppard PentonWolf  . Endoscopic retrograde cholangiopancreatography (ercp) with propofol N/A 11/22/2015    Procedure: ENDOSCOPIC RETROGRADE CHOLANGIOPANCREATOGRAPHY (ERCP) WITH PROPOFOL;  Surgeon: Midge Miniumarren Wohl, MD;  Location: [facility];  Service: Endoscopy;  Laterality: N/A;  . Cholecystectomy N/A 11/23/2015    Procedure: LAPAROSCOPIC CHOLECYSTECTOMY;  Surgeon: Gladis Riffleatherine L Loflin, MD;  Location: [facility];  Service: General;  Laterality: N/A;    Home Medications:    Medication List       This list is accurate as of: 03/08/16 10:17 AM.  Always use your most recent med list.               aspirin 81 MG tablet  Take 81 mg by mouth daily.     docusate sodium 100 MG capsule  Commonly known as:  COLACE  Take 100 mg by mouth daily.     levothyroxine 50 MCG tablet  Commonly known as:  SYNTHROID, LEVOTHROID  Take 1 tablet (50 mcg total) by  mouth daily.     losartan 50 MG tablet  Commonly known as:  COZAAR  Take 1 tablet (50 mg total) by mouth daily.     MULTIVITAMIN ADULT PO  Take 1 tablet by mouth daily.     sildenafil 100 MG tablet  Commonly known as:  VIAGRA  Take 1 tablet (100 mg total) by mouth daily as needed for erectile dysfunction.        Allergies:  Allergies  Allergen Reactions  . Fentanyl Itching  . Crestor  [Rosuvastatin Calcium]     Other reaction(s): Muscle Pain (severe)  . Ibuprofen   . Penicillins     Family History: Family History  Problem Relation Age of Onset  . Pancreatic cancer Mother   . Neuropathy Father   . Throat cancer Father   . Lung  cancer Father     Social History:  reports that he has never smoked. He does not have any smokeless tobacco history on file. He reports that he drinks alcohol. He reports that he does not use illicit drugs.  ROS: UROLOGY Frequent Urination?: Yes Hard to postpone urination?: No Burning/pain with urination?: No Get up at night to urinate?: Yes Leakage of urine?: No Urine stream starts and stops?: No Trouble starting stream?: No Do you have to strain to urinate?: No Blood in urine?: No Urinary tract infection?: No Sexually transmitted disease?: No Injury to kidneys or bladder?: No Painful intercourse?: No Weak stream?: No Erection problems?: No Penile pain?: No  Gastrointestinal Nausea?: No Vomiting?: No Indigestion/heartburn?: Yes Diarrhea?: No Constipation?: No  Constitutional Fever: No Night sweats?: No Weight loss?: No Fatigue?: No  Skin Skin rash/lesions?: No Itching?: No  Eyes Blurred vision?: No Double vision?: No  Ears/Nose/Throat Sore throat?: No Sinus problems?: No  Hematologic/Lymphatic Swollen glands?: No Easy bruising?: No  Cardiovascular Leg swelling?: No Chest pain?: No  Respiratory Cough?: No Shortness of breath?: No  Endocrine Excessive thirst?: No  Musculoskeletal Back pain?: No Joint pain?: No  Neurological Headaches?: No Dizziness?: No  Psychologic Depression?: No Anxiety?: No  Physical Exam: BP 151/91 mmHg  Pulse 87  Ht  (1.88 m)  Wt 262 lb (118.842 kg)  BMI 33.62 kg/m2  Constitutional:  Alert and oriented, No acute distress. HEENT: [place] AT, moist mucus membranes.  Trachea midline, no masses. Cardiovascular: No clubbing, cyanosis, or edema. Respiratory: Normal respiratory effort, no increased work of breathing. GI: Abdomen is soft, nontender, nondistended, no abdominal masses GU: No CVA tenderness. Normal phallus. Testicles descended equally bilaterally. Left varicocele noted. No masses.  DRE: 50+, smooth, no  nodules. Normal sphincter tone. Skin: No rashes, bruises or suspicious lesions. Neurologic: Grossly intact, no focal deficits, moving all 4 extremities. Psychiatric: Normal mood and affect.  Laboratory Data: Lab Results  Component Value Date   WBC 5.2 11/22/2015   HGB 12.6* 11/22/2015   HCT 37.5* 11/22/2015   MCV 91.5 11/22/2015   PLT 177 11/22/2015    Lab Results  Component Value Date   CREATININE 0.92 11/23/2015    Lab Results  Component Value Date   HGBA1C 5.7* 12/28/2015     Pertinent Imaging: CLINICAL DATA: Left renal stones.  EXAM: ABDOMEN - 1 VIEW  COMPARISON: Abdominal radiograph 06/01/2015  FINDINGS: The bowel gas pattern is normal. The previously identified left renal stone is not seen on today's radiograph. Osseous structures are grossly normal.  IMPRESSION: Nonvisualization of previously identified left renal stones. Please note that there is a moderate amount of formed stool overlying the  left renal shadow and small renal stones may be obscured.   Electronically Signed  By: Ted Mcalpine M.D.  On: 03/07/2016 16:42  Assessment & Plan:    1. Nephrolithiasis Continue to encourage PO intake hydration.   2. Elevated PSA/ Recheck PSA/ DRE today  3. BPH  Will stop Cialis for now, if needed will start finasteride  4. Erectile dysfunction Stop daily cialis - transition to as needed Viagra 100 mg prn  5. Renal cyst-bilateral Bosniak 2 No further work up or surveillance is necessary.   Return in about 1 year (around 03/08/2017) for PSA/ DRE/ IPSS .  [name], MD  [facility] [address] [place], [place] [address] [phone]

## 2016-03-09 ENCOUNTER — Telehealth: Payer: Self-pay | Admitting: *Deleted

## 2016-03-09 DIAGNOSIS — N529 Male erectile dysfunction, unspecified: Secondary | ICD-10-CM

## 2016-03-09 LAB — PSA: PROSTATE SPECIFIC AG, SERUM: 5.3 ng/mL — AB (ref 0.0–4.0)

## 2016-03-09 MED ORDER — SILDENAFIL CITRATE 100 MG PO TABS
100.0000 mg | ORAL_TABLET | Freq: Every day | ORAL | Status: DC | PRN
Start: 2016-03-09 — End: 2018-04-19

## 2016-03-09 NOTE — Telephone Encounter (Signed)
Spoke with patient and gave him PSA results. While speaking to patient he stated that Dr. Apolinar JunesBrandon gave him a rx for viagra and told him if he had problems filling it to call back and we could send it to a compound pharmacy for him. [facility] saying [facility]? I let him know I wasn't aware of that one but we could get it more affordable for him and that he would get a call back and let him know where to go pick it up. Patient ok with plan.

## 2016-03-09 NOTE — Telephone Encounter (Signed)
Spoke with patient to let him know medication had been sent to [facility]. Patient understands and thanks us.

## 2016-03-09 NOTE — Telephone Encounter (Signed)
-----   Message from Vanna ScotlandAshley Brandon, MD sent at 03/09/2016 11:55 AM EDT ----- PSA stable at 5.3, down from 6.6 8 months ago

## 2016-06-06 ENCOUNTER — Ambulatory Visit: Payer: BLUE CROSS/BLUE SHIELD | Admitting: Urology

## 2016-08-07 IMAGING — CR DG CHEST 2V
1 series · 2 of 2 positions shown · non-contrast
Comparison: PA and lateral chest x-ray November 04, 2015

CLINICAL DATA: History of pneumonia, follow-up study, no symptoms
today.

EXAM:
CHEST  2 VIEW

[Series 1: dg chest 2 view · 0.14mm/px · 2 of 2 slices shown]
[im 1/2]
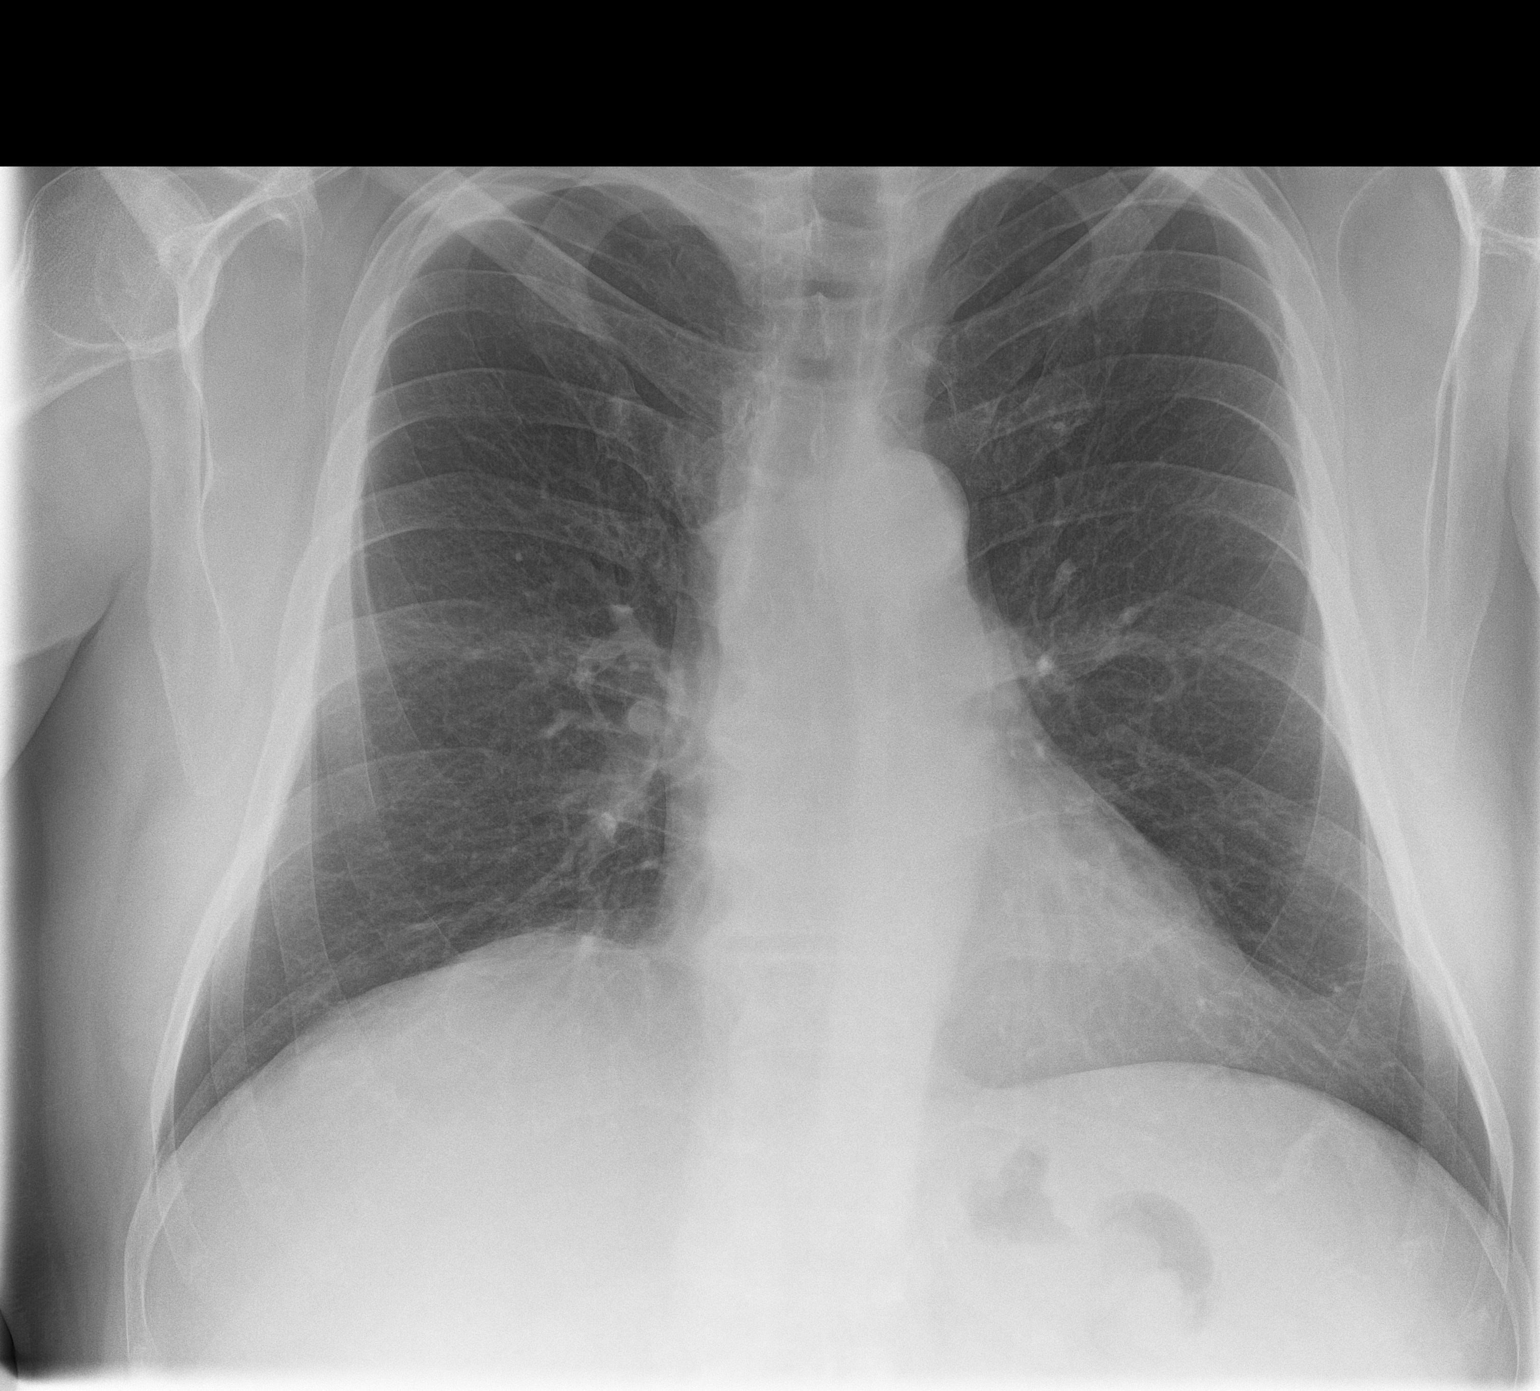
[im 2/2]
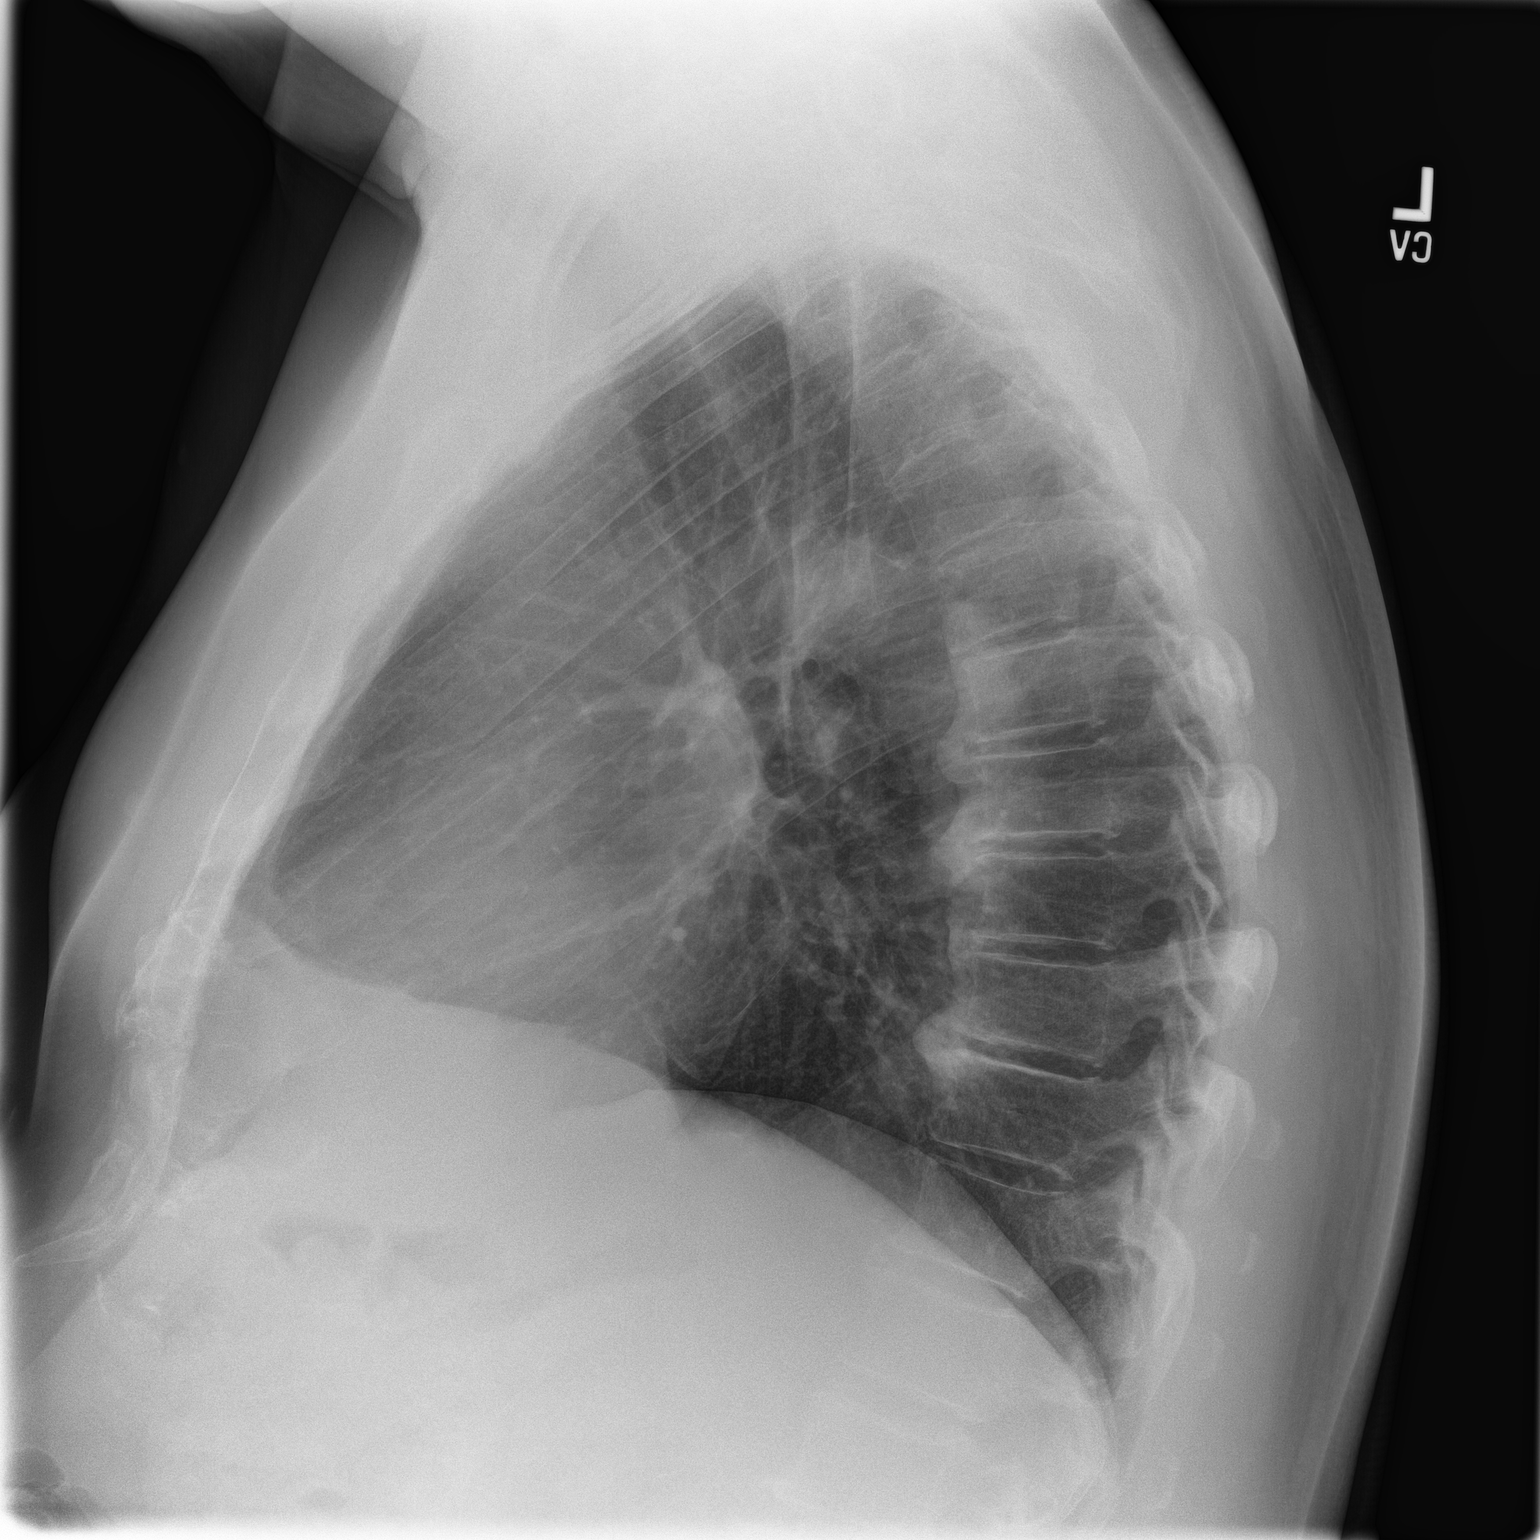

[2 of 2 positions shown; findings below may reference images not displayed]

FINDINGS: There has been interval clearing of the right middle lobe pneumonia.
No new infiltrate is observed. The heart and pulmonary vascularity
are normal. The mediastinum is normal in width. There is no pleural
effusion. There is calcification of the anterior longitudinal
ligament of the mid thoracic spine.
IMPRESSION: Interval clearing of right middle lobe pneumonia. There is no active
cardiopulmonary disease.

## 2016-09-19 ENCOUNTER — Other Ambulatory Visit: Payer: Self-pay | Admitting: Family Medicine

## 2017-02-22 ENCOUNTER — Other Ambulatory Visit: Payer: Self-pay | Admitting: Family Medicine

## 2017-03-09 ENCOUNTER — Encounter: Payer: Self-pay | Admitting: Urology

## 2017-03-09 ENCOUNTER — Ambulatory Visit (INDEPENDENT_AMBULATORY_CARE_PROVIDER_SITE_OTHER): Payer: PRIVATE HEALTH INSURANCE | Admitting: Urology

## 2017-03-09 VITALS — BP 137/86 | HR 101 | Ht 74.0 in | Wt 252.0 lb

## 2017-03-09 DIAGNOSIS — N529 Male erectile dysfunction, unspecified: Secondary | ICD-10-CM | POA: Diagnosis not present

## 2017-03-09 DIAGNOSIS — R972 Elevated prostate specific antigen [PSA]: Secondary | ICD-10-CM

## 2017-03-09 DIAGNOSIS — N200 Calculus of kidney: Secondary | ICD-10-CM | POA: Diagnosis not present

## 2017-03-09 NOTE — Progress Notes (Signed)
11:12 AM  03/09/17   [name] [name] 05/02/1951 [id number]  Referring provider: Malva Limes, MD [address] [place], [place] [address]  Chief Complaint  Patient presents with  . Elevated PSA    1year    HPI: 66 year old gentleman with multiple GU issues he returns today for 12 month follow-up.  Kidney stones Nephrolithiasis and obstructing stone in 2015 requiring ureteroscopy. He also 24 urinalysis done last year that showed hypocitraturia and low urine output. He was recommended to start potassium citrate at that time, but he did not want to start a new medication.  Most recent KUB 03/07/2016 shows no obvious stone but overlying bowel gas.  No flank pain or gross hematuria since last visit.  No stone episodes over past year.    Increased water intake dramatically.      Erectile dysfunction He has a history of erectile dysfunction. Previously did well on Cialis but had acid reflux related to this. Currently on sildenafil.  He has not been using this medication much as he often doesn't need it.    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  BPH / history of elevated PSA PSA fluctuation, 5.3 02/2016 down from 6.6  PSA repeated today Bx 08/05/16 consistent with chronic inflammation TRUS volume 93 g  Voiding issues well controlled.  Nocturia x0-1.  He notes overall improvement with with his voiding symptoms at baseline and is pleased.     PMH: Past Medical History:  Diagnosis Date  . Acute gallstone pancreatitis   . Arthritis   . Chronic kidney disease    stones  . Dysrhythmia   . GERD (gastroesophageal reflux disease)   . HOH (hard of hearing)   . Hypertension   . Hypothyroidism   . Prostate disease   . Sleep apnea     Surgical History: Past Surgical History:  Procedure Laterality Date  . ARM SKIN LESION BIOPSY / EXCISION    . CATARACT EXTRACTION W/PHACO Right 02/23/2015   Procedure: CATARACT EXTRACTION PHACO  AND INTRAOCULAR LENS PLACEMENT (IOC);  Surgeon: Galen Manila, MD;  Location: [facility];  Service: Ophthalmology;  Laterality: Right;  [place] 01:01 AP%23.5 CDE 14.49 fluid pack lot #[id number]  . CATARACT EXTRACTION W/PHACO Left 03/30/2015   Procedure: CATARACT EXTRACTION PHACO AND INTRAOCULAR LENS PLACEMENT (IOC);  Surgeon: Galen Manila, MD;  Location: [facility];  Service: Ophthalmology;  Laterality: Left;  [place]: 01:08.3 AP%: 22.1 CDE: 15.12  Fluid Lot # [id number]  . CHOLECYSTECTOMY N/A 11/23/2015   Procedure: LAPAROSCOPIC CHOLECYSTECTOMY;  Surgeon: Gladis Riffle, MD;  Location: [facility];  Service: General;  Laterality: N/A;  . COLONOSCOPY    . ENDOSCOPIC RETROGRADE CHOLANGIOPANCREATOGRAPHY (ERCP) WITH PROPOFOL N/A 11/22/2015   Procedure: ENDOSCOPIC RETROGRADE CHOLANGIOPANCREATOGRAPHY (ERCP) WITH PROPOFOL;  Surgeon: Midge Minium, MD;  Location: [facility];  Service: Endoscopy;  Laterality: N/A;  . INCISION AND DRAINAGE     anal infection  . KIDNEY STONE SURGERY    . Right Ureterolithiasis with stent placement  04/29/2014   Dr. Sheppard Penton    Home Medications:  Allergies as of 03/09/2017      Reactions   Fentanyl Itching   Crestor  [rosuvastatin Calcium]    Other reaction(s): Muscle Pain (severe)   Ibuprofen    Penicillins       Medication List       Accurate as of 03/09/17 11:12 AM. Always use your most recent med list.  aspirin 81 MG tablet Take 81 mg by mouth daily.   levothyroxine 50 MCG tablet Commonly known as:  SYNTHROID, LEVOTHROID TAKE 1 TABLET BY MOUTH EVERY DAY   losartan 50 MG tablet Commonly known as:  COZAAR TAKE 1 TABLET (50 MG TOTAL) BY MOUTH DAILY.   MULTIVITAMIN ADULT PO Take 1 tablet by mouth daily.   sildenafil 100 MG tablet Commonly known as:  VIAGRA Take 1 tablet (100 mg total) by mouth daily as needed for erectile dysfunction.       Allergies:  Allergies  Allergen Reactions  . Fentanyl Itching  . Crestor  [Rosuvastatin Calcium]       Other reaction(s): Muscle Pain (severe)  . Ibuprofen   . Penicillins     Family History: Family History  Problem Relation Age of Onset  . Pancreatic cancer Mother   . Neuropathy Father   . Throat cancer Father   . Lung cancer Father     Social History:  reports that he has never smoked. He has never used smokeless tobacco. He reports that he drinks alcohol. He reports that he does not use drugs.  ROS: UROLOGY Frequent Urination?: Yes Hard to postpone urination?: Yes Burning/pain with urination?: No Get up at night to urinate?: Yes Leakage of urine?: No Urine stream starts and stops?: No Trouble starting stream?: No Do you have to strain to urinate?: No Blood in urine?: No Urinary tract infection?: No Sexually transmitted disease?: No Injury to kidneys or bladder?: No Painful intercourse?: No Weak stream?: No Erection problems?: No Penile pain?: No  Gastrointestinal Nausea?: No Vomiting?: No Indigestion/heartburn?: No Diarrhea?: No Constipation?: No  Constitutional Fever: No Night sweats?: No Weight loss?: No Fatigue?: No  Skin Skin rash/lesions?: No Itching?: No  Eyes Blurred vision?: No Double vision?: No  Ears/Nose/Throat Sore throat?: No Sinus problems?: No  Hematologic/Lymphatic Swollen glands?: No Easy bruising?: No  Cardiovascular Leg swelling?: No Chest pain?: No  Respiratory Cough?: No Shortness of breath?: No  Endocrine Excessive thirst?: No  Musculoskeletal Back pain?: No Joint pain?: No  Neurological Headaches?: No Dizziness?: No  Psychologic Depression?: No Anxiety?: No  Physical Exam: BP 137/86   Pulse (!) 101   Ht 6\' 2"  (1.88 m)   Wt 252 lb (114.3 kg)   BMI 32.35 kg/m   Constitutional:  Alert and oriented, No acute distress. HEENT: [place] AT, moist mucus membranes.  Trachea midline, no masses. Cardiovascular: No clubbing, cyanosis, or edema. Respiratory: Normal respiratory effort, no increased work of  breathing. GI: Abdomen is soft, nontender, nondistended, no abdominal masses GU: No CVA tenderness.  DRE: 50+, smooth, no nodules. Normal sphincter tone. Skin: No rashes, bruises or suspicious lesions. Neurologic: Grossly intact, no focal deficits, moving all 4 extremities. Psychiatric: Normal mood and affect.  Laboratory Data: Lab Results  Component Value Date   WBC 5.2 11/22/2015   HGB 12.6 (L) 11/22/2015   HCT 37.5 (L) 11/22/2015   MCV 91.5 11/22/2015   PLT 177 11/22/2015    Lab Results  Component Value Date   CREATININE 0.92 11/23/2015    Lab Results  Component Value Date   HGBA1C 5.7 (H) 12/28/2015     Pertinent Imaging: No new imaging  Assessment & Plan:    1. Nephrolithiasis Continue to encourage PO intake hydration.  No recent stone episodes Will follow clinically  2. Elevated PSA/ PSA today- will call with results Rectal exam enlarged, unchanged Will follow annually  3. BPH Voiding symptoms improved without intervention  4. Erectile dysfunction  Viagra prn  5. Renal cyst-bilateral Bosniak 2 No further work up or surveillance is necessary.   Return in about 1 year (around 03/09/2018), or PSA/ DRE.  [name], MD  [facility] [address] [place], [place] [address] [phone]

## 2017-03-10 LAB — PSA: Prostate Specific Ag, Serum: 4.3 ng/mL — ABNORMAL HIGH (ref 0.0–4.0)

## 2017-03-12 ENCOUNTER — Telehealth: Payer: Self-pay | Admitting: *Deleted

## 2017-03-12 NOTE — Telephone Encounter (Signed)
Spoke with patient and gave results. 

## 2017-03-12 NOTE — Telephone Encounter (Signed)
-----   Message from Vanna ScotlandAshley Brandon, MD sent at 03/11/2017  1:07 PM EDT ----- PSA is down to 4.3. This is excellent news.

## 2017-03-22 ENCOUNTER — Other Ambulatory Visit: Payer: Self-pay | Admitting: Family Medicine

## 2017-04-22 ENCOUNTER — Other Ambulatory Visit: Payer: Self-pay | Admitting: Family Medicine

## 2017-05-22 ENCOUNTER — Other Ambulatory Visit: Payer: Self-pay | Admitting: Family Medicine

## 2017-05-29 ENCOUNTER — Ambulatory Visit (INDEPENDENT_AMBULATORY_CARE_PROVIDER_SITE_OTHER): Payer: PRIVATE HEALTH INSURANCE | Admitting: Family Medicine

## 2017-05-29 ENCOUNTER — Encounter: Payer: Self-pay | Admitting: Family Medicine

## 2017-05-29 VITALS — BP 138/70 | Temp 99.3°F | Resp 16 | Ht 74.0 in | Wt 260.0 lb

## 2017-05-29 DIAGNOSIS — I10 Essential (primary) hypertension: Secondary | ICD-10-CM

## 2017-05-29 DIAGNOSIS — Z1211 Encounter for screening for malignant neoplasm of colon: Secondary | ICD-10-CM

## 2017-05-29 DIAGNOSIS — Z0000 Encounter for general adult medical examination without abnormal findings: Secondary | ICD-10-CM

## 2017-05-29 DIAGNOSIS — Z9989 Dependence on other enabling machines and devices: Secondary | ICD-10-CM

## 2017-05-29 DIAGNOSIS — Z125 Encounter for screening for malignant neoplasm of prostate: Secondary | ICD-10-CM | POA: Diagnosis not present

## 2017-05-29 DIAGNOSIS — G4733 Obstructive sleep apnea (adult) (pediatric): Secondary | ICD-10-CM

## 2017-05-29 DIAGNOSIS — E039 Hypothyroidism, unspecified: Secondary | ICD-10-CM

## 2017-05-29 DIAGNOSIS — E785 Hyperlipidemia, unspecified: Secondary | ICD-10-CM | POA: Diagnosis not present

## 2017-05-29 DIAGNOSIS — Z23 Encounter for immunization: Secondary | ICD-10-CM | POA: Diagnosis not present

## 2017-05-29 MED ORDER — LEVOTHYROXINE SODIUM 50 MCG PO TABS: 50.0000 ug | ORAL_TABLET | Freq: Every day | ORAL | 2 refills | Status: DC

## 2017-05-29 NOTE — Progress Notes (Signed)
Patient: [name] [name], Male    DOB: March 13, 1951, 66 y.o.   MRN: [id number] Visit Date: 05/29/2017  Today's Provider: Mila Merry, MD   Chief Complaint  Patient presents with  . Preventative exam  . Hypertension  . Hyperlipidemia  . Hypothyroidism   Subjective:    IPPE [name] [name] is a 66 y.o. male. He feels well. He reports exercising occasionally. He reports he is sleeping well.Has no specific complaints today.    Hypertension, follow-up:  BP Readings from Last 3 Encounters:  05/29/17 138/70  03/09/17 137/86  03/08/16 (!) 151/91    He was last seen for hypertension 1 years ago.  BP at that visit was 110/80. Management since that visit includes no changes. He reports good compliance with treatment. He is not having side effects.  He is not exercising. He is adherent to low salt diet.   Outside blood pressures are not being checked. He is experiencing none.  Patient denies exertional chest pressure/discomfort, lower extremity edema and palpitations.   Cardiovascular risk factors include dyslipidemia.   Weight trend: stable Wt Readings from Last 3 Encounters:  05/29/17 260 lb (117.9 kg)  03/09/17 252 lb (114.3 kg)  03/08/16 262 lb (118.8 kg)    Current diet: well balanced    Lipid/Cholesterol, Follow-up:   Last seen for this1 years ago.  Management changes since that visit include no changes. . Last Lipid Panel:    Component Value Date/Time   CHOL 220 (H) 12/28/2015 0822   TRIG 130 12/28/2015 0822   HDL 51 12/28/2015 0822   CHOLHDL 4.3 12/28/2015 0822   LDLCALC 143 (H) 12/28/2015 0822    Risk factors for vascular disease include hypercholesterolemia  He reports good compliance with treatment. He is not having side effects.  Current symptoms include none and have been stable. Weight trend: stable Prior visit with dietician: no Current diet: well balanced Current exercise: none  Wt Readings from Last 3 Encounters:  05/29/17 260 lb  (117.9 kg)  03/09/17 252 lb (114.3 kg)  03/08/16 262 lb (118.8 kg)        Review of Systems  Constitutional: Negative.   HENT: Negative.   Eyes: Negative.   Respiratory: Negative.   Cardiovascular: Negative.   Gastrointestinal: Negative.   Endocrine: Negative.   Genitourinary: Positive for frequency.       Has BPH.   Musculoskeletal: Positive for neck pain and neck stiffness.  Skin: Negative.   Allergic/Immunologic: Negative.   Neurological: Negative.   Hematological: Negative.   Psychiatric/Behavioral: Negative.     Social History   Social History  . Marital status: Married    Spouse name: N/A  . Number of children: 3  . Years of education: N/A   Occupational History  . Self employed     Owns [employer]   Social History Main Topics  . Smoking status: Never Smoker  . Smokeless tobacco: Never Used  . Alcohol use 0.0 oz/week     Comment: moderate use; 8-9 drinks a week  . Drug use: No  . Sexual activity: Not on file   Other Topics Concern  . Not on file   Social History Narrative  . No narrative on file    Past Medical History:  Diagnosis Date  . Acute gallstone pancreatitis   . Arthritis   . Chronic kidney disease    stones  . Dysrhythmia   . GERD (gastroesophageal reflux disease)   . HOH (hard of hearing)   .  Hypertension   . Hypothyroidism   . Prostate disease   . Sleep apnea      Patient Active Problem List   Diagnosis Date Noted  . Pancreatitis due to common bile duct stone   . Hyperlipidemia 04/13/2015  . Hypersomnia 04/13/2015  . Kidney stones 04/13/2015  . Elevated transaminase level 04/13/2015  . History of nephrolithiasis 04/13/2015  . Obstructive sleep apnea 04/13/2015  . Obesity 04/13/2015  . Hypothyroidism 03/03/2015  . Benign prostatic hyperplasia with urinary obstruction 07/17/2012  . Chronic prostatitis 07/17/2012  . Decreased libido 07/17/2012  . Elevated prostate specific antigen (PSA) 07/17/2012  . ED  (erectile dysfunction) of organic origin 07/17/2012  . Neuralgia neuritis, sciatic nerve 07/17/2012  . Family history of endocrine and metabolic disease 09/81/1914  . 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 Essential hypertension 02/16/2006    Past Surgical History:  Procedure Laterality Date  . ARM SKIN LESION BIOPSY / EXCISION    . CATARACT EXTRACTION W/PHACO Right 02/23/2015   Procedure: CATARACT EXTRACTION PHACO AND INTRAOCULAR LENS PLACEMENT (IOC);  Surgeon: Galen Manila, MD;  Location: [facility] ORS;  Service: Ophthalmology;  Laterality: Right;  [place] 01:01 AP%23.5 CDE 14.49 fluid pack lot #[id number]  . CATARACT EXTRACTION W/PHACO Left 03/30/2015   Procedure: CATARACT EXTRACTION PHACO AND INTRAOCULAR LENS PLACEMENT (IOC);  Surgeon: Galen Manila, MD;  Location: [facility] OR[facility];  Service: Ophthalmology;  Laterality: Left;  [place]: 01:08.3 AP%: 22.1 CDE: 15.12  Fluid Lot # [id number]  . CHOLECYSTECTOMY N/A 11/23/2015   Procedure: LAPAROSCOPIC CHOLECYSTECTOMY;  Surgeon: Gladis Riffle, MD;  Location: [facility] ORS;  Service: General;  Laterality: N/A;  . COLONOSCOPY    . ENDOSCOPIC RETROGRADE CHOLANGIOPANCREATOGRAPHY (ERCP) WITH PROPOFOL N/A 11/22/2015   Procedure: ENDOSCOPIC RETROGRADE CHOLANGIOPANCREATOGRAPHY (ERCP) WITH PROPOFOL;  Surgeon: Midge Minium, MD;  Location: [facility];  Service: Endoscopy;  Laterality: N/A;  . INCISION AND DRAINAGE     anal infection  . KIDNEY STONE SURGERY    . Right Ureterolithiasis with stent placement  04/29/2014   Dr. Sheppard Penton    His family history includes Lung cancer in his father; Neuropathy in his father; Pancreatic cancer in his mother; Throat cancer in his father.      Current Outpatient Prescriptions:  .  aspirin 81 MG tablet, Take 81 mg by mouth daily., Disp: , Rfl:  .  levothyroxine (SYNTHROID, LEVOTHROID) 50 MCG tablet, TAKE 1 TABLET BY MOUTH EVERY DAY, Disp: 30 tablet, Rfl: 0 .  losartan (COZAAR) 50 MG tablet, TAKE 1 TABLET (50  MG TOTAL) BY MOUTH DAILY., Disp: 90 tablet, Rfl: 4 .  Multiple Vitamins-Minerals (MULTIVITAMIN ADULT PO), Take 1 tablet by mouth daily., Disp: , Rfl:  .  sildenafil (VIAGRA) 100 MG tablet, Take 1 tablet (100 mg total) by mouth daily as needed for erectile dysfunction. (Patient not taking: Reported on 03/09/2017), Disp: 6 tablet, Rfl: 11  Patient Care Team: Malva Limes, MD as PCP - General (Family Medicine) Milagros Evener, MD as Consulting Physician (Psychiatry)     Objective:   Vitals: BP 138/70 (BP Location: Right Arm, Patient Position: Sitting, Cuff Size: Normal)   Temp 99.3 F (37.4 C)   Resp 16   Ht  (1.88 m)   Wt 260 lb (117.9 kg)   BMI 33.38 kg/m   Physical Exam   General Appearance:    Alert, cooperative, no distress, appears stated age, obese  Head:    Normocephalic, without obvious abnormality, atraumatic  Eyes:    PERRL, conjunctiva/corneas clear, EOM's  intact, fundi    benign, both eyes       Ears:    Normal TM's and external ear canals, both ears  Nose:   Nares normal, septum midline, mucosa normal, no drainage   or sinus tenderness  Throat:   Lips, mucosa, and tongue normal; teeth and gums normal  Neck:   Supple, symmetrical, trachea midline, no adenopathy;       thyroid:  No enlargement/tenderness/nodules; no carotid   bruit or JVD  Back:     Symmetric, no curvature, ROM normal, no CVA tenderness  Lungs:     Clear to auscultation bilaterally, respirations unlabored  Chest wall:    No tenderness or deformity  Heart:    Regular rate and rhythm, S1 and S2 normal, no murmur, rub   or gallop  Abdomen:     Soft, non-tender, bowel sounds active all four quadrants,    no masses, no organomegaly  Genitalia:    deferred  Rectal:    deferred  Extremities:   Extremities normal, atraumatic, no cyanosis or edema  Pulses:   2+ and symmetric all extremities  Skin:   Skin color, texture, turgor normal, no rashes or lesions  Lymph nodes:   Cervical, supraclavicular,  and axillary nodes normal  Neurologic:   CNII-XII intact. Normal strength, sensation and reflexes      throughout    Activities of Daily Living In your present state of health, do you have any difficulty performing the following activities: 05/29/2017  Hearing? Y  Vision? N  Difficulty concentrating or making decisions? N  Walking or climbing stairs? N  Dressing or bathing? N  Doing errands, shopping? N  Some recent data might be hidden    Fall Risk Assessment Fall Risk  05/29/2017 12/17/2015  Falls in the past year? No No     Depression Screen PHQ 2/9 Scores 05/29/2017 12/17/2015  PHQ - 2 Score 0 0  PHQ- 9 Score - 4    Cognitive Testing - 6-CIT 6CIT Screen 05/29/2017  What Year? 0 points  What month? 0 points  What time? 0 points  Count back from 20 0 points  Months in reverse 0 points  Repeat phrase 0 points  Total Score 0       Assessment & Plan:    IPPE Reviewed patient's Family Medical History Reviewed and updated list of patient's medical providers Assessment of cognitive impairment was done Assessed patient's functional ability Established a written schedule for health screening services Health Risk Assessent Completed and Reviewed  Exercise Activities and Dietary recommendations Goals    None      Immunization History  Administered Date(s) Administered  . Influenza-Unspecified 06/13/2015  . Tdap 01/21/2010  . Zoster 09/14/2011    Health Maintenance  Topic Date Due  . HIV Screening  05/18/1967  . INFLUENZA VACCINE  03/28/2017  . PNA vac Low Risk Adult (1 of 2 - PCV13) 05/17/2017  . COLONOSCOPY  01/01/2018  . TETANUS/TDAP  01/22/2020  . Hepatitis C Screening  Completed     Discussed health benefits of physical activity, and encouraged him to engage in regular exercise appropriate for his age and condition.    1. Annual physical exam   2. Need for influenza vaccination  - Flu vaccine HIGH DOSE PF (Fluzone High dose)  3. Need for  pneumococcal vaccination - Pneumococcal conjugate vaccine 13-valent IM  4. Prostate cancer screening Followed by Dr. Apolinar Junes  5. Colon cancer screening Due for screening in 2019  6.  Essential hypertension Well controlled.  Continue current medications.   - COMPLETE METABOLIC PANEL WITH GFR - Hemoglobin A1c - EKG 12-Lead  7. Hyperlipidemia, unspecified hyperlipidemia type Intolerant to rosuvastatin. Consider ezetimibe after reviewing labs.  - Lipid panel  8. Hypothyroidism, unspecified type  - TSH  9. OSA on CPAP Using CPAP consistently with improved QOL.     Mila Merry, MD  [facility]

## 2017-06-07 ENCOUNTER — Encounter: Payer: Self-pay | Admitting: Emergency Medicine

## 2017-06-07 ENCOUNTER — Emergency Department
Admission: EM | Admit: 2017-06-07 | Discharge: 2017-06-07 | Disposition: A | Discharge status: Home / Self Care | Payer: PRIVATE HEALTH INSURANCE | Attending: Emergency Medicine | Admitting: Emergency Medicine

## 2017-06-07 DIAGNOSIS — Y999 Unspecified external cause status: Secondary | ICD-10-CM | POA: Insufficient documentation

## 2017-06-07 DIAGNOSIS — Z79899 Other long term (current) drug therapy: Secondary | ICD-10-CM | POA: Diagnosis not present

## 2017-06-07 DIAGNOSIS — I10 Essential (primary) hypertension: Secondary | ICD-10-CM | POA: Diagnosis not present

## 2017-06-07 DIAGNOSIS — W269XXA Contact with unspecified sharp object(s), initial encounter: Secondary | ICD-10-CM | POA: Insufficient documentation

## 2017-06-07 DIAGNOSIS — Y939 Activity, unspecified: Secondary | ICD-10-CM | POA: Diagnosis not present

## 2017-06-07 DIAGNOSIS — Z7982 Long term (current) use of aspirin: Secondary | ICD-10-CM | POA: Diagnosis not present

## 2017-06-07 DIAGNOSIS — S61213A Laceration without foreign body of left middle finger without damage to nail, initial encounter: Secondary | ICD-10-CM

## 2017-06-07 DIAGNOSIS — E039 Hypothyroidism, unspecified: Secondary | ICD-10-CM | POA: Insufficient documentation

## 2017-06-07 DIAGNOSIS — Y929 Unspecified place or not applicable: Secondary | ICD-10-CM | POA: Insufficient documentation

## 2017-06-07 MED ORDER — LIDOCAINE HCL (PF) 1 % IJ SOLN
INTRAMUSCULAR | Status: AC
  Administered 2017-06-07: 16:00:00
  Filled 2017-06-07: qty 5

## 2017-06-07 MED ORDER — OXYCODONE-ACETAMINOPHEN 5-325 MG PO TABS: 1.0000 | ORAL_TABLET | Freq: Four times a day (QID) | ORAL | 0 refills | Status: DC | PRN

## 2017-06-07 MED ORDER — TETANUS-DIPHTH-ACELL PERTUSSIS 5-2.5-18.5 LF-MCG/0.5 IM SUSP
0.5000 mL | Freq: Once | INTRAMUSCULAR | Status: AC
Start: 2017-06-07 — End: 2017-06-07
  Administered 2017-06-07: 0.5 mL via INTRAMUSCULAR
  Filled 2017-06-07: qty 0.5

## 2017-06-07 MED ORDER — OXYCODONE-ACETAMINOPHEN 5-325 MG PO TABS
1.0000 | ORAL_TABLET | Freq: Once | ORAL | Status: AC
Start: 2017-06-07 — End: 2017-06-07
  Administered 2017-06-07: 1 via ORAL
  Filled 2017-06-07: qty 1

## 2017-06-07 NOTE — Discharge Instructions (Signed)
Finger splint for 3-5 days May remove for personal hygiene purposes.

## 2017-06-07 NOTE — ED Provider Notes (Signed)
[facility] Emergency Department Provider Note   ____________________________________________   First MD Initiated Contact with Patient 06/07/17 1407     (approximate)  I have reviewed the triage vital signs and the nursing notes.   HISTORY  Chief Complaint Laceration    HPI [name] [name] is a 66 y.o. male patient presented laceration to the dorsal extensor to the third digit left hand. Patient was cutting fine-needle 0.2. Patient bleeding was controlled in triage with direct pressure. Patient denies loss of sensation or loss of function of the affected digit.Patient rates his pain as a 4/10. Patient described a pain as "achy". Patient is right-hand dominant and tetanus shot is not up-to-date.   Past Medical History:  Diagnosis Date  . Acute gallstone pancreatitis   . Arthritis   . Chronic kidney disease    stones  . Dysrhythmia   . GERD (gastroesophageal reflux disease)   . HOH (hard of hearing)   . Hypertension   . Hypothyroidism   . Prostate disease   . Sleep apnea     Patient Active Problem List   Diagnosis Date Noted  . Pancreatitis due to common bile duct stone   . Hyperlipidemia 04/13/2015  . Hypersomnia 04/13/2015  . Kidney stones 04/13/2015  . Elevated transaminase level 04/13/2015  . History of nephrolithiasis 04/13/2015  . OSA on CPAP 04/13/2015  . Obesity 04/13/2015  . Hypothyroidism 03/03/2015  . Benign prostatic hyperplasia with urinary obstruction 07/17/2012  . Chronic prostatitis 07/17/2012  . Decreased libido 07/17/2012  . Elevated prostate specific antigen (PSA) 07/17/2012  . 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 Neuralgia neuritis, sciatic nerve 07/17/2012  . Family history of endocrine and metabolic disease 16/05/9603  . 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 Essential hypertension 02/16/2006    Past Surgical History:  Procedure Laterality Date  . ARM SKIN LESION  BIOPSY / EXCISION    . CATARACT EXTRACTION W/PHACO Right 02/23/2015   Procedure: CATARACT EXTRACTION PHACO AND INTRAOCULAR LENS PLACEMENT (IOC);  Surgeon: Galen Manila, MD;  Location: [facility] ORS;  Service: Ophthalmology;  Laterality: Right;  [place] 01:01 AP%23.5 CDE 14.49 fluid pack lot #[id number]  . CATARACT EXTRACTION W/PHACO Left 03/30/2015   Procedure: CATARACT EXTRACTION PHACO AND INTRAOCULAR LENS PLACEMENT (IOC);  Surgeon: Galen Manila, MD;  Location: [facility] OR[facility];  Service: Ophthalmology;  Laterality: Left;  [place]: 01:08.3 AP%: 22.1 CDE: 15.12  Fluid Lot # [id number]  . CHOLECYSTECTOMY N/A 11/23/2015   Procedure: LAPAROSCOPIC CHOLECYSTECTOMY;  Surgeon: Gladis Riffle, MD;  Location: [facility] ORS;  Service: General;  Laterality: N/A;  . COLONOSCOPY    . ENDOSCOPIC RETROGRADE CHOLANGIOPANCREATOGRAPHY (ERCP) WITH PROPOFOL N/A 11/22/2015   Procedure: ENDOSCOPIC RETROGRADE CHOLANGIOPANCREATOGRAPHY (ERCP) WITH PROPOFOL;  Surgeon: Midge Minium, MD;  Location: [facility] END[facility];  Service: Endoscopy;  Laterality: N/A;  . INCISION AND DRAINAGE     anal infection  . KIDNEY STONE SURGERY    . Right Ureterolithiasis with stent placement  04/29/2014   Dr. Sheppard Penton    Prior to Admission medications   Medication Sig Start Date End Date Taking? Authorizing Provider  aspirin 81 MG tablet Take 81 mg by mouth daily.    [provider]  levothyroxine (SYNTHROID, LEVOTHROID) 50 MCG tablet Take 1 tablet (50 mcg total) by mouth daily. 05/29/17   Malva Limes, MD  losartan (COZAAR) 50 MG tablet TAKE 1 TABLET (50 MG TOTAL) BY MOUTH DAILY. 09/19/16   Malva Limes, MD  Multiple Vitamins-Minerals (MULTIVITAMIN ADULT PO)  Take 1 tablet by mouth daily. 01/21/10   [provider]  oxyCODONE-acetaminophen (ROXICET) 5-325 MG tablet Take 1 tablet by mouth every 6 (six) hours as needed. 06/07/17 06/07/18  Joni Reining, PA-C  sildenafil (VIAGRA) 100 MG tablet Take 1 tablet (100 mg total) by mouth daily as  needed for erectile dysfunction. Patient not taking: Reported on 03/09/2017 03/09/16   [name], MD    Allergies Fentanyl; Crestor  [rosuvastatin calcium]; Ibuprofen; and Penicillins  Family History  Problem Relation Age of Onset  . Pancreatic cancer Mother   . Neuropathy Father   . Throat cancer Father   . Lung cancer Father     Social History Social History  Substance Use Topics  . Smoking status: Never Smoker  . Smokeless tobacco: Never Used  . Alcohol use 0.0 oz/week     Comment: moderate use; 8-9 drinks a week    Review of Systems  Constitutional: No fever/chills Eyes: No visual changes. ENT: No sore throat. Cardiovascular: Denies chest pain. Respiratory: Denies shortness of breath. Gastrointestinal: No abdominal pain.  No nausea, no vomiting.  No diarrhea.  No constipation. Genitourinary: Negative for dysuria. Musculoskeletal: Negative for back pain. Skin: Negative for rash. Laceration dorsal aspect of the third digit left hand Neurological: Negative for headaches, focal weakness or numbness. Psychiatric:ADD Endocrine:Hypertension and hyperlipidemia Hematological/Lymphatic:Hypothyroidism Allergic/Immunilogical: **}  ____________________________________________   PHYSICAL EXAM:  VITAL SIGNS: ED Triage Vitals  Enc Vitals Group     BP 06/07/17 1339 (!) 157/105     Pulse Rate 06/07/17 1339 99     Resp 06/07/17 1339 20     Temp 06/07/17 1339 98.3 F (36.8 C)     Temp src --      SpO2 06/07/17 1339 96 %     Weight 06/07/17 1337 260 lb (117.9 kg)     Height 06/07/17 1337  (1.88 m)     Head Circumference --      Peak Flow --      Pain Score --      Pain Loc --      Pain Edu? --      Excl. in GC? --     Constitutional: Alert and oriented. Anxious  Cardiovascular: Normal rate, regular rhythm. Grossly normal heart sounds.  Good peripheral circulation. Elevated blood pressure Respiratory: Normal respiratory effort.  No retractions. Lungs  CTAB. Gastrointestinal: Soft and nontender. No distention. No abdominal bruits. No CVA tenderness. Neurologic:  Normal speech and language. No gross focal neurologic deficits are appreciated. No gait instability. Skin:  . Finger laceration Psychiatric: Mood and affect are normal. Speech and behavior are normal.  ____________________________________________   LABS (all labs ordered are listed, but only abnormal results are displayed)  Labs Reviewed - No data to display ____________________________________________  EKG   ____________________________________________  RADIOLOGY  No results found.  ____________________________________________   PROCEDURES  Procedure(s) performed: LACERATION REPAIR Performed by: Joni Reining Authorized by: Joni Reining Consent: Verbal consent obtained. Risks and benefits: risks, benefits and alternatives were discussed Consent given by: patient Patient identity confirmed: provided demographic data Prepped and Draped in normal sterile fashion Wound explored  Laceration Location: Third digit left hand  Laceration Length: 2.5 cm  No Foreign Bodies seen or palpated  Anesthesia: Digital block   Local anesthetic: lidocaine 1% without epinephrine  Anesthetic total: 4 ml  Irrigation method: syringe Amount of cleaning: standard  Skin closure: 4-0  Number of sutures: 8   Technique: Interrupted Patient tolerance: Patient  tolerated the procedure well with no immediate complications.   Procedures  Critical Care performed: No  ____________________________________________   INITIAL IMPRESSION / ASSESSMENT AND PLAN / ED COURSE  As part of my medical decision making, I reviewed the following data within the electronic MEDICAL RECORD NUMBER    Patient presents laceration dorsal aspect of the third digit left hand. Patient given discharge care instructions for suture wound care. Patient advised to wear finger splint for 3-5 days as  needed. Patient advised to have suture removal in 10 days. Patient advised take pain medication as needed. Return to ED if wound re-opens before healing is completed.    ____________________________________________   FINAL CLINICAL IMPRESSION(S) / ED DIAGNOSES  Final diagnoses:  Laceration of left middle finger without foreign body without damage to nail, initial encounter      NEW MEDICATIONS STARTED DURING THIS VISIT:  New Prescriptions   OXYCODONE-ACETAMINOPHEN (ROXICET) 5-325 MG TABLET    Take 1 tablet by mouth every 6 (six) hours as needed.     Note:  This document was prepared using Dragon voice recognition software and may include unintentional dictation errors.    Joni Reining, PA-C 06/07/17 1507    Sharman Cheek, MD 06/09/17 [phone]

## 2017-06-07 NOTE — ED Triage Notes (Addendum)
Pt here for laceration to 3rd digit left hand. Cut with "fine needle point type tool".  Is bleeding, wrapped in triage.  Able to move finger and sensation intact.  No blood thinners.

## 2017-06-07 NOTE — ED Notes (Signed)
Left top of hand lac with triage dressing. Ends of fingers warm. Able to move fingers, wrist. Bleeding controlled.

## 2017-06-18 ENCOUNTER — Ambulatory Visit (INDEPENDENT_AMBULATORY_CARE_PROVIDER_SITE_OTHER): Payer: PRIVATE HEALTH INSURANCE | Admitting: Family Medicine

## 2017-06-18 ENCOUNTER — Encounter: Payer: Self-pay | Admitting: Family Medicine

## 2017-06-18 VITALS — BP 144/100 | HR 96 | Temp 98.8°F | Resp 16 | Wt 269.0 lb

## 2017-06-18 DIAGNOSIS — S61213A Laceration without foreign body of left middle finger without damage to nail, initial encounter: Secondary | ICD-10-CM

## 2017-06-18 NOTE — Progress Notes (Signed)
       Patient: [name] [name] Male    DOB: 1951/07/12   66 y.o.   MRN: [id number] Visit Date: 06/18/2017  Today's Provider: Mila Merryonald Fisher, MD   Chief Complaint  Patient presents with  . Follow-up  . Suture / Staple Removal   Subjective:    Suture / Staple Removal  Treated in ED: 11 days ago. The redness has improved. The swelling has improved. The pain has not changed. There is difficulty moving the extremity or digit due to pain.    Follow up ER visit  Patient was seen in ER for Laceration of left middle finger on 06/07/2017. Treatment for this included 8 sutures placed. Patient was advised to wear finger splint for 3-4 days as needed, and to have sutures removed in 10 days. Prescription for Oxycodone/ Acetaminophen given to patient. He reports good compliance with treatment. He reports this condition is Improved.  ------------------------------------------------------------------------------------      Allergies  Allergen Reactions  . Fentanyl Itching  . Crestor  [Rosuvastatin Calcium]     Other reaction(s): Muscle Pain (severe)  . Ibuprofen   . Penicillins      Current Outpatient Prescriptions:  .  aspirin 81 MG tablet, Take 81 mg by mouth daily., Disp: , Rfl:  .  levothyroxine (SYNTHROID, LEVOTHROID) 50 MCG tablet, Take 1 tablet (50 mcg total) by mouth daily., Disp: 30 tablet, Rfl: 2 .  losartan (COZAAR) 50 MG tablet, TAKE 1 TABLET (50 MG TOTAL) BY MOUTH DAILY., Disp: 90 tablet, Rfl: 4 .  Multiple Vitamins-Minerals (MULTIVITAMIN ADULT PO), Take 1 tablet by mouth daily., Disp: , Rfl:  .  oxyCODONE-acetaminophen (ROXICET) 5-325 MG tablet, Take 1 tablet by mouth every 6 (six) hours as needed., Disp: 20 tablet, Rfl: 0 .  sildenafil (VIAGRA) 100 MG tablet, Take 1 tablet (100 mg total) by mouth daily as needed for erectile dysfunction., Disp: 6 tablet, Rfl: 11  Review of Systems  Constitutional: Negative for appetite change, chills and fever.  Respiratory: Negative  for chest tightness, shortness of breath and wheezing.   Cardiovascular: Negative for chest pain and palpitations.  Gastrointestinal: Negative for abdominal pain, nausea and vomiting.  Musculoskeletal: Positive for myalgias (pain in left hand from injury).    Social History  Substance Use Topics  . Smoking status: Never Smoker  . Smokeless tobacco: Never Used  . Alcohol use 0.0 oz/week     Comment: moderate use; 8-9 drinks a week   Objective:   BP (!) 144/100 (BP Location: Left Arm, Patient Position: Sitting, Cuff Size: Large)   Pulse 96   Temp 98.8 F (37.1 C) (Oral)   Resp 16   Wt 269 lb (122 kg)   SpO2 97% Comment: room air  BMI 34.54 kg/m  There were no vitals filed for this visit.   Physical Exam  General appearance: alert, well developed, well nourished, cooperative and in no distress Head: Normocephalic, without obvious abnormality, atraumatic Respiratory: Respirations even and unlabored, normal respiratory rate Extremities: Healing laceration dorsum of left middle finger with minimal erythema and no discharge or bleeding.       Assessment & Plan:     1. Laceration of left middle finger without foreign body, nail damage status unspecified, initial encounter Healing well Removed 8 sutures without difficulty. Applied antibiotic and adhesive bandage. Cautious ROM exercises. Call if any problems.       Mila Merryonald Fisher, MD  [facility]

## 2017-08-10 ENCOUNTER — Other Ambulatory Visit: Payer: Self-pay | Admitting: Family Medicine

## 2017-09-26 ENCOUNTER — Other Ambulatory Visit: Payer: Self-pay | Admitting: Family Medicine

## 2018-03-08 ENCOUNTER — Ambulatory Visit: Payer: PRIVATE HEALTH INSURANCE | Admitting: Urology

## 2018-04-19 ENCOUNTER — Ambulatory Visit (INDEPENDENT_AMBULATORY_CARE_PROVIDER_SITE_OTHER): Payer: Medicare Other | Admitting: Urology

## 2018-04-19 ENCOUNTER — Encounter: Payer: Self-pay | Admitting: Urology

## 2018-04-19 VITALS — BP 160/69 | HR 99 | Ht 74.0 in | Wt 242.0 lb

## 2018-04-19 DIAGNOSIS — R351 Nocturia: Secondary | ICD-10-CM

## 2018-04-19 DIAGNOSIS — N5203 Combined arterial insufficiency and corporo-venous occlusive erectile dysfunction: Secondary | ICD-10-CM

## 2018-04-19 DIAGNOSIS — Z87442 Personal history of urinary calculi: Secondary | ICD-10-CM | POA: Diagnosis not present

## 2018-04-19 DIAGNOSIS — N401 Enlarged prostate with lower urinary tract symptoms: Secondary | ICD-10-CM | POA: Diagnosis not present

## 2018-04-19 DIAGNOSIS — R972 Elevated prostate specific antigen [PSA]: Secondary | ICD-10-CM | POA: Diagnosis not present

## 2018-04-19 MED ORDER — SILDENAFIL CITRATE 20 MG PO TABS: 20.0000 mg | ORAL_TABLET | ORAL | 11 refills | Status: DC | PRN

## 2018-04-19 NOTE — Progress Notes (Signed)
04/19/2018 2:54 PM   [name] 04/05/1951 [id number]  Referring provider: Malva Limes, MD [address] [place], [place] [address]  No chief complaint on file.   HPI: 67 year old gentleman with multiple GU issues he returns today for 12 month follow-up.  Kidney stones Nephrolithiasis and obstructing stone in 2015 requiring ureteroscopy. He also 24 urinalysis done last year that showed hypocitraturia and low urine output. He was recommended to start potassium citrate at that time, but he did not want to start a new medication.  Most recent KUB 03/07/2016 shows no obvious stone but overlying bowel gas.  No interval imaging since that time.  No flank pain or gross hematuria since last visit.  No stone episodes over past year.  Drinking lots of water and exercising.     Erectile dysfunction He has a history of erectile dysfunction. Previously did well on Cialis but had acid reflux related to this. Currently on sildenafil.  Requesting refills today.    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  BPH / history of elevated PSA PSA fluctuation, most recent PSA 4.3 on 03/09/2017, as high as 6.6 in 2016. PSA repeated today -pending Bx 08/05/16 consistent with chronic inflammation TRUS volume 93 g Urinary symptoms  stable, nocturia x 1-2.   Occasional hesitancy.  Good stream, adequate sensation of emptying.  Minimal frequency or urgency.  Overall pleased.  No UTIs.       PMH: Past Medical History:  Diagnosis Date  . Acute gallstone pancreatitis   . Arthritis   . Chronic kidney disease    stones  . Dysrhythmia   . GERD (gastroesophageal reflux disease)   . HOH (hard of hearing)   . Hypertension   . Hypothyroidism   . Prostate disease   . Sleep apnea     Surgical History: Past Surgical History:  Procedure Laterality Date  . ARM SKIN LESION BIOPSY / EXCISION    . CATARACT EXTRACTION W/PHACO Right 02/23/2015   Procedure: CATARACT EXTRACTION PHACO AND INTRAOCULAR LENS PLACEMENT (IOC);  Surgeon: Galen Manila, MD;  Location: [facility];  Service: Ophthalmology;  Laterality: Right;  [place] 01:01 AP%23.5 CDE 14.49 fluid pack lot #[id number]  . CATARACT EXTRACTION W/PHACO Left 03/30/2015   Procedure: CATARACT EXTRACTION PHACO AND INTRAOCULAR LENS PLACEMENT (IOC);  Surgeon: Galen Manila, MD;  Location: [facility];  Service: Ophthalmology;  Laterality: Left;  [place]: 01:08.3 AP%: 22.1 CDE: 15.12  Fluid Lot # [id number]  . CHOLECYSTECTOMY N/A 11/23/2015   Procedure: LAPAROSCOPIC CHOLECYSTECTOMY;  Surgeon: Gladis Riffle, MD;  Location: [facility];  Service: General;  Laterality: N/A;  . COLONOSCOPY    . ENDOSCOPIC RETROGRADE CHOLANGIOPANCREATOGRAPHY (ERCP) WITH PROPOFOL N/A 11/22/2015   Procedure: ENDOSCOPIC RETROGRADE CHOLANGIOPANCREATOGRAPHY (ERCP) WITH PROPOFOL;  Surgeon: Midge Minium, MD;  Location: [facility];  Service: Endoscopy;  Laterality: N/A;  . INCISION AND DRAINAGE     anal infection  . KIDNEY STONE SURGERY    . Right Ureterolithiasis with stent placement  04/29/2014   Dr. Sheppard Penton    Home Medications:  Allergies as of 04/19/2018      Reactions   Fentanyl Itching   Crestor  [rosuvastatin Calcium]    Other reaction(s): Muscle Pain (severe)   Ibuprofen    Penicillins       Medication List        Accurate as of 04/19/18 11:59 PM. Always use your most recent med list.          levothyroxine  50 MCG tablet Commonly known as:  SYNTHROID, LEVOTHROID TAKE 1 TABLET BY MOUTH EVERY DAY   losartan 50 MG tablet Commonly known as:  COZAAR TAKE 1 TABLET (50 MG TOTAL) BY MOUTH DAILY.   MULTIVITAMIN ADULT PO Take 1 tablet by mouth daily.   sildenafil 20 MG tablet Commonly known as:  REVATIO Take 1 tablet (20 mg total) by mouth as needed. Take 1-5 tabs as needed prior to intercourse       Allergies:  Allergies  Allergen Reactions  . Fentanyl Itching  . Crestor  [Rosuvastatin Calcium]      Other reaction(s): Muscle Pain (severe)  . Ibuprofen   . Penicillins     Family History: Family History  Problem Relation Age of Onset  . Pancreatic cancer Mother   . Neuropathy Father   . Throat cancer Father   . Lung cancer Father     Social History:  reports that he has never smoked. He has never used smokeless tobacco. He reports that he drinks alcohol. He reports that he does not use drugs.  ROS: UROLOGY Frequent Urination?: Yes Hard to postpone urination?: No Burning/pain with urination?: No Get up at night to urinate?: Yes Leakage of urine?: No Urine stream starts and stops?: No Trouble starting stream?: No Do you have to strain to urinate?: No Blood in urine?: No Urinary tract infection?: No Sexually transmitted disease?: No Injury to kidneys or bladder?: No Painful intercourse?: No Weak stream?: No Erection problems?: Yes Penile pain?: No  Gastrointestinal Nausea?: No Vomiting?: No Indigestion/heartburn?: No Diarrhea?: No Constipation?: No  Constitutional Fever: No Night sweats?: No Weight loss?: No Fatigue?: No  Skin Skin rash/lesions?: No Itching?: No  Eyes Blurred vision?: No Double vision?: No  Ears/Nose/Throat Sore throat?: No Sinus problems?: No  Hematologic/Lymphatic Swollen glands?: No Easy bruising?: No  Cardiovascular Leg swelling?: No Chest pain?: No  Respiratory Cough?: No Shortness of breath?: No  Endocrine Excessive thirst?: No  Musculoskeletal Back pain?: No Joint pain?: Yes  Neurological Headaches?: No Dizziness?: No  Psychologic Depression?: No Anxiety?: No  Physical Exam: BP (!) 160/69   Pulse 99   Ht 6\' 2"  (1.88 m)   Wt 242 lb (109.8 kg)   BMI 31.07 kg/m   Constitutional:  Alert and oriented, No acute distress. HEENT: [place] AT, moist mucus membranes.  Trachea midline, no masses. Cardiovascular: No clubbing, cyanosis, or edema. Respiratory: Normal respiratory effort, no increased work of  breathing. GI: Abdomen is soft, nontender, nondistended, no abdominal masses GU: No CVA tenderness Rectal: Normal suture tone.  50 cc prostate, nontender, no nodules. Skin: No rashes, bruises or suspicious lesions. Neurologic: Grossly intact, no focal deficits, moving all 4 extremities. Psychiatric: Normal mood and affect.  Laboratory Data: Lab Results  Component Value Date   WBC 5.2 11/22/2015   HGB 12.6 (L) 11/22/2015   HCT 37.5 (L) 11/22/2015   MCV 91.5 11/22/2015   PLT 177 11/22/2015    Lab Results  Component Value Date   CREATININE 0.92 11/23/2015    Lab Results  Component Value Date   HGBA1C 5.7 (H) 12/28/2015    Urinalysis N/A  Pertinent Imaging:  N/A  Assessment & Plan:    1. Elevated PSA Personal history of fluctuating/elevated PSA status post negative biopsy x2 which is reassuring We will continue to follow PSA annually and defer imaging/treatment unless PSA rises outside of previous range or is upward trending He is agreeable this plan - PSA  2. BPH associated with nocturia Symptoms relatively  well controlled Continue to defer medical therapy We briefly discussed surgical options for BPH, not interested at this time  3. Combined arterial insufficiency and corporo-venous occlusive erectile dysfunction Doing well. Requests refill of this today, will print prescription and he will try shop  4. History of kidney stones No kidney stone episodes in the past year We will follow clinically   Return in about 1 year (around 04/20/2019) for IPSS/ PVR/ PSA.  Vanna ScotlandAshley Kilo Eshelman, MD  [facility] [address], [address] [place], [place] [address] [phone]

## 2018-04-20 LAB — PSA: PROSTATE SPECIFIC AG, SERUM: 5.2 ng/mL — AB (ref 0.0–4.0)

## 2018-07-29 ENCOUNTER — Other Ambulatory Visit: Payer: Self-pay | Admitting: Family Medicine

## 2018-08-08 ENCOUNTER — Other Ambulatory Visit: Payer: Self-pay | Admitting: Family Medicine

## 2019-01-30 ENCOUNTER — Other Ambulatory Visit: Payer: Self-pay | Admitting: Family Medicine

## 2019-02-15 ENCOUNTER — Other Ambulatory Visit: Payer: Self-pay | Admitting: Family Medicine

## 2019-04-23 ENCOUNTER — Other Ambulatory Visit: Payer: Self-pay

## 2019-04-23 ENCOUNTER — Ambulatory Visit (INDEPENDENT_AMBULATORY_CARE_PROVIDER_SITE_OTHER): Payer: Medicare Other | Admitting: Urology

## 2019-04-23 ENCOUNTER — Encounter: Payer: Self-pay | Admitting: Urology

## 2019-04-23 VITALS — BP 161/101 | HR 99 | Ht 74.0 in | Wt 261.0 lb

## 2019-04-23 DIAGNOSIS — R351 Nocturia: Secondary | ICD-10-CM | POA: Diagnosis not present

## 2019-04-23 DIAGNOSIS — N401 Enlarged prostate with lower urinary tract symptoms: Secondary | ICD-10-CM | POA: Diagnosis not present

## 2019-04-23 DIAGNOSIS — N5203 Combined arterial insufficiency and corporo-venous occlusive erectile dysfunction: Secondary | ICD-10-CM | POA: Diagnosis not present

## 2019-04-23 DIAGNOSIS — Z87442 Personal history of urinary calculi: Secondary | ICD-10-CM

## 2019-04-23 DIAGNOSIS — R972 Elevated prostate specific antigen [PSA]: Secondary | ICD-10-CM

## 2019-04-23 LAB — BLADDER SCAN AMB NON-IMAGING

## 2019-04-23 NOTE — Progress Notes (Signed)
04/23/2019 2:56 PM   [name] [name] 06/26/1951 [id number]  Referring provider: Birdie Sons, [address] [place],  [place] [address]  Chief Complaint  Patient presents with  . Elevated PSA    1year w/PSA &PVR  . Benign Prostatic Hypertrophy    HPI: 68 year old gentlemanwith multiple GU issues he returns today for92month follow-up.  He was last seen in 03/2018.  Interim, no new medical changes.  Kidney stones Nephrolithiasis and obstructing stone in 2015 requiring ureteroscopy. He also 24 urinalysis done last year that showed hypocitraturia and low urine output. He was recommended to start potassium citrate at that time, but he did not want to start a new medication.  Most recent KUB 03/07/2016 shows no obvious stone but overlying bowel gas.  No interval imaging since that time.  He denies any flank pain for the past 12 months.  No stone episodes.  Erectile dysfunction He has a history of erectile dysfunction.Previously did well on Cialis but had acid reflux related to this. Currently on sildenafil. No issues with this medication other than occasional headache the following day.  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  BPH / history of elevated PSA PSAfluctuation, most recent PSA 5.2 on 03/2018, as high as 6.6 in 2016. PSA repeated today -pending Bx 08/05/16 consistent with chronic inflammation TRUS volume 93 g Urinary symptoms are essentially stable although he does sometimes have to strain a bit to start a stream.  His stream is fairly decent in caliber to get that started.  No urgency or frequency.  He occasionally gets up once a night to void.  He is not bothered by this.  He also reports that for some morning, he has some difficulty emptying his bladder.  He will void a second time after he brushes his teeth and feels like he empties on the second try.  He takes no BPH meds.   PMH: Past Medical History:   Diagnosis Date  . Acute gallstone pancreatitis   . Arthritis   . Chronic kidney disease    stones  . Dysrhythmia   . GERD (gastroesophageal reflux disease)   . HOH (hard of hearing)   . Hypertension   . Hypothyroidism   . Prostate disease   . Sleep apnea     Surgical History: Past Surgical History:  Procedure Laterality Date  . ARM SKIN LESION BIOPSY / EXCISION    . CATARACT EXTRACTION W/PHACO Right 02/23/2015   Procedure: CATARACT EXTRACTION PHACO AND INTRAOCULAR LENS PLACEMENT (IOC);  Surgeon: Birder Robson, MD;  Location: [facility];  Service: Ophthalmology;  Laterality: Right;  [place] 01:01 AP%23.5 CDE 14.49 fluid pack lot #[id number]  . CATARACT EXTRACTION W/PHACO Left 03/30/2015   Procedure: CATARACT EXTRACTION PHACO AND INTRAOCULAR LENS PLACEMENT (IOC);  Surgeon: Birder Robson, MD;  Location: [facility];  Service: Ophthalmology;  Laterality: Left;  [place]: 01:08.3 AP%: 22.1 CDE: 15.12  Fluid Lot # [id number]  . CHOLECYSTECTOMY N/A 11/23/2015   Procedure: LAPAROSCOPIC CHOLECYSTECTOMY;  Surgeon: Hubbard Robinson, MD;  Location: [facility];  Service: General;  Laterality: N/A;  . COLONOSCOPY    . ENDOSCOPIC RETROGRADE CHOLANGIOPANCREATOGRAPHY (ERCP) WITH PROPOFOL N/A 11/22/2015   Procedure: ENDOSCOPIC RETROGRADE CHOLANGIOPANCREATOGRAPHY (ERCP) WITH PROPOFOL;  Surgeon: Lucilla Lame, MD;  Location: [facility];  Service: Endoscopy;  Laterality: N/A;  . INCISION AND DRAINAGE     anal infection  . KIDNEY STONE SURGERY    . Right Ureterolithiasis with stent placement  04/29/2014  Dr. Sheppard PentonWolf    Home Medications:  Allergies as of 04/23/2019      Reactions   Fentanyl Itching   Crestor  [rosuvastatin Calcium]    Other reaction(s): Muscle Pain (severe)   Ibuprofen    Penicillins       Medication List       Accurate as of April 23, 2019 11:59 PM. If you have any questions, ask your nurse or doctor.        levothyroxine 50 MCG tablet Commonly known as: SYNTHROID Take 1 tablet  (50 mcg total) by mouth daily.   losartan 50 MG tablet Commonly known as: COZAAR TAKE 1 TABLET BY MOUTH EVERY DAY   MULTIVITAMIN ADULT PO Take 1 tablet by mouth daily.   sildenafil 20 MG tablet Commonly known as: Revatio Take 1 tablet (20 mg total) by mouth as needed. Take 1-5 tabs as needed prior to intercourse       Allergies:  Allergies  Allergen Reactions  . Fentanyl Itching  . Crestor  [Rosuvastatin Calcium]     Other reaction(s): Muscle Pain (severe)  . Ibuprofen   . Penicillins     Family History: Family History  Problem Relation Age of Onset  . Pancreatic cancer Mother   . Neuropathy Father   . Throat cancer Father   . Lung cancer Father     Social History:  reports that he has never smoked. He has never used smokeless tobacco. He reports current alcohol use. He reports that he does not use drugs.  ROS: UROLOGY Frequent Urination?: Yes Hard to postpone urination?: No Burning/pain with urination?: No Get up at night to urinate?: Yes Leakage of urine?: No Urine stream starts and stops?: No Trouble starting stream?: Yes Do you have to strain to urinate?: No Blood in urine?: No Urinary tract infection?: No Sexually transmitted disease?: No Injury to kidneys or bladder?: No Painful intercourse?: No Weak stream?: No Erection problems?: Yes Penile pain?: No  Gastrointestinal Nausea?: No Vomiting?: No Indigestion/heartburn?: Yes Diarrhea?: No Constipation?: No  Constitutional Fever: No Night sweats?: No Weight loss?: No Fatigue?: No  Skin Skin rash/lesions?: No Itching?: No  Eyes Blurred vision?: No Double vision?: No  Ears/Nose/Throat Sore throat?: No Sinus problems?: No  Hematologic/Lymphatic Swollen glands?: No Easy bruising?: No  Cardiovascular Leg swelling?: No Chest pain?: No  Respiratory Cough?: No Shortness of breath?: No  Endocrine Excessive thirst?: No  Musculoskeletal Back pain?: No Joint pain?: No   Neurological Headaches?: No Dizziness?: No  Psychologic Depression?: No Anxiety?: No  Physical Exam: BP (!) 161/101   Pulse 99   Ht 6\' 2"  (1.88 m)   Wt 261 lb (118.4 kg)   BMI 33.51 kg/m   Constitutional:  Alert and oriented, No acute distress. HEENT: [place] AT, moist mucus membranes.  Trachea midline, no masses. Cardiovascular: No clubbing, cyanosis, or edema. Respiratory: Normal respiratory effort, no increased work of breathing. GI: Abdomen is soft, nontender, nondistended, no abdominal masses Rectal: Normal sphincter tone.  Enlarged prostate, nontender no nodules. Skin: No rashes, bruises or suspicious lesions. Neurologic: Grossly intact, no focal deficits, moving all 4 extremities. Psychiatric: Normal mood and affect.  Laboratory Data: Lab Results  Component Value Date   WBC 5.2 11/22/2015   HGB 12.6 (L) 11/22/2015   HCT 37.5 (L) 11/22/2015   MCV 91.5 11/22/2015   PLT 177 11/22/2015    Lab Results  Component Value Date   CREATININE 0.92 11/23/2015     Lab Results  Component Value Date  HGBA1C 5.7 (H) 12/28/2015    Pertinent Imaging: Results for orders placed or performed in visit on 04/23/19  PSA  Result Value Ref Range   Prostate Specific Ag, Serum 5.0 (H) 0.0 - 4.0 ng/mL  BLADDER SCAN AMB NON-IMAGING  Result Value Ref Range   Scan Result     Assessment & Plan:    1. BPH associated with nocturia Symptomatically stable other than occasional difficulty starting a stream and sensation of incomplete bladder emptying for several months elevated today although he voided well over an hour ago  We discussed that the the natural history of enlarging prostate is that he may develop progressive and worsening symptoms He was offered medications but declined today, previously did not tolerate Flomax well We discussed outlet procedures, but he like to hold off for the time being, reevaluate next year based on his symptoms - BLADDER SCAN AMB NON-IMAGING  2.  Elevated PSA Personal history of fluctuating/elevated PSA His PSA has been relatively stable status post negative biopsy times several We will continue to follow him annually until his age 33 PSA pending today - PSA - PSA  3. Combined arterial insufficiency and corporo-venous occlusive erectile dysfunction Continue sildenafil as needed  4. History of kidney stones Clinically asymptomatic, will follow clinically   Follow-up in 1 year with PSA/DRE/PVR/IPSS  [name], MD  [facility] [address] [place], [place] [address] [phone]

## 2019-04-24 ENCOUNTER — Telehealth: Payer: Self-pay | Admitting: *Deleted

## 2019-04-24 LAB — PSA: Prostate Specific Ag, Serum: 5 ng/mL — ABNORMAL HIGH (ref 0.0–4.0)

## 2019-04-24 NOTE — Telephone Encounter (Signed)
-----   Message from Hollice Espy, MD sent at 04/24/2019  7:51 AM EDT ----- PSA is stable at 5.0.  See you next year.  Hollice Espy, MD

## 2019-04-24 NOTE — Telephone Encounter (Signed)
Notified patient as instructed, patient pleased. Discussed follow-up appointments, patient agrees  

## 2019-04-25 ENCOUNTER — Ambulatory Visit: Payer: Medicare Other | Admitting: Urology

## 2019-04-29 ENCOUNTER — Other Ambulatory Visit: Payer: Self-pay | Admitting: Family Medicine

## 2019-05-16 ENCOUNTER — Encounter: Payer: Self-pay | Admitting: Family Medicine

## 2019-05-16 ENCOUNTER — Other Ambulatory Visit: Payer: Self-pay

## 2019-05-16 ENCOUNTER — Ambulatory Visit (INDEPENDENT_AMBULATORY_CARE_PROVIDER_SITE_OTHER): Payer: Medicare Other | Admitting: Family Medicine

## 2019-05-16 VITALS — BP 142/88 | HR 72 | Temp 98.2°F | Resp 16 | Ht 74.0 in | Wt 266.0 lb

## 2019-05-16 DIAGNOSIS — Z9989 Dependence on other enabling machines and devices: Secondary | ICD-10-CM

## 2019-05-16 DIAGNOSIS — R739 Hyperglycemia, unspecified: Secondary | ICD-10-CM

## 2019-05-16 DIAGNOSIS — Z23 Encounter for immunization: Secondary | ICD-10-CM | POA: Diagnosis not present

## 2019-05-16 DIAGNOSIS — Z6834 Body mass index (BMI) 34.0-34.9, adult: Secondary | ICD-10-CM

## 2019-05-16 DIAGNOSIS — E669 Obesity, unspecified: Secondary | ICD-10-CM

## 2019-05-16 DIAGNOSIS — Z1211 Encounter for screening for malignant neoplasm of colon: Secondary | ICD-10-CM

## 2019-05-16 DIAGNOSIS — G4733 Obstructive sleep apnea (adult) (pediatric): Secondary | ICD-10-CM

## 2019-05-16 DIAGNOSIS — I10 Essential (primary) hypertension: Secondary | ICD-10-CM | POA: Diagnosis not present

## 2019-05-16 DIAGNOSIS — E039 Hypothyroidism, unspecified: Secondary | ICD-10-CM | POA: Diagnosis not present

## 2019-05-16 NOTE — Patient Instructions (Addendum)
.   Please review the attached list of medications and notify my office if there are any errors.   . Please bring all of your medications to every appointment so we can make sure that our medication list is the same as yours.   It is recommended to engage in 150 minutes of moderate exercise every week.   . Work on losing weight to get your BMI under 30, or about 220 lb

## 2019-05-16 NOTE — Progress Notes (Signed)
Patient: [name] EatonDonald Scripter Male    DOB: November 28, 1950   68 y.o.   MRN: [id number] Visit Date: 05/16/2019  Today's Provider: Mila Merryonald Fisher, MD   Chief Complaint  Patient presents with  . Hypertension  . Hypothyroidism   Subjective:   HPI    Hypertension, follow-up:  BP Readings from Last 3 Encounters:  05/16/19 (!) 142/88  04/23/19 (!) 161/101  04/19/18 (!) 160/69    He was last seen for hypertension 2 years ago.  BP at that visit was 138/70. Management since that visit includes no changes. He reports good compliance with treatment. He is not having side effects.  He is not exercising. He is adherent to low salt diet.   Outside blood pressures are not being checked. He is experiencing none.  Patient denies exertional chest pressure/discomfort, lower extremity edema and palpitations.   Cardiovascular risk factors include dyslipidemia.    Weight trend: stable Wt Readings from Last 3 Encounters:  05/16/19 266 lb (120.7 kg)  04/23/19 261 lb (118.4 kg)  04/19/18 242 lb (109.8 kg)    Current diet: well balanced  Hypothyroidism, follow up: Patient was last seen in the office 2 years ago. No medications were changed since last visit. He is currently taking levothyroxine 50mcg daily, and reports good compliance and good symptom control.  Lab Results  Component Value Date   TSH 3.880 12/28/2015      Allergies  Allergen Reactions  . Fentanyl Itching  . Crestor  [Rosuvastatin Calcium]     Other reaction(s): Muscle Pain (severe)  . Ibuprofen   . Penicillins      Current Outpatient Medications:  .  levothyroxine (SYNTHROID) 50 MCG tablet, TAKE 1 TABLET BY MOUTH EVERY DAY, Disp: 30 tablet, Rfl: 0 .  losartan (COZAAR) 50 MG tablet, TAKE 1 TABLET BY MOUTH EVERY DAY, Disp: 90 tablet, Rfl: 4 .  Multiple Vitamins-Minerals (MULTIVITAMIN ADULT PO), Take 1 tablet by mouth daily., Disp: , Rfl:  .  sildenafil (REVATIO) 20 MG tablet, Take 1 tablet (20 mg total) by mouth as  needed. Take 1-5 tabs as needed prior to intercourse (Patient not taking: Reported on 05/16/2019), Disp: 30 tablet, Rfl: 11  Review of Systems  Constitutional: Negative for activity change and fatigue.  Respiratory: Negative for cough and shortness of breath.   Cardiovascular: Negative for chest pain, palpitations and leg swelling.  Endocrine: Negative for cold intolerance, heat intolerance, polydipsia, polyphagia and polyuria.  Neurological: Negative for dizziness, light-headedness and headaches.  Psychiatric/Behavioral: Negative for agitation, self-injury, sleep disturbance and suicidal ideas. The patient is not nervous/anxious.     Social History   Tobacco Use  . Smoking status: Never Smoker  . Smokeless tobacco: Never Used  Substance Use Topics  . Alcohol use: Yes    Alcohol/week: 0.0 standard drinks    Comment: moderate use; 8-9 drinks a week      Objective:   BP (!) 142/88   Pulse 72   Temp 98.2 F (36.8 C)   Resp 16   Ht 6\' 2"  (1.88 m)   Wt 266 lb (120.7 kg)   SpO2 98%   BMI 34.15 kg/m  Vitals:   05/16/19 0847  BP: (!) 142/88  Pulse: 72  Resp: 16  Temp: 98.2 F (36.8 C)  SpO2: 98%  Weight: 266 lb (120.7 kg)  Height: 6\' 2"  (1.88 m)  Body mass index is 34.15 kg/m.   Physical Exam  General Appearance:    Alert, cooperative, no distress,  obese  Eyes:    PERRL, conjunctiva/corneas clear, EOM's intact       Lungs:     Clear to auscultation bilaterally, respirations unlabored  Heart:    Normal heart rate. Normal rhythm. No murmurs, rubs, or gallops.   MS:   All extremities are intact.   Neurologic:   Awake, alert, oriented x 3. No apparent focal neurological           defect.          Assessment & Plan    1. Essential hypertension Well controlled.  Continue current medications.   - Comprehensive metabolic panel - Lipid panel  2. Hypothyroidism, unspecified type  - TSH  3. Need for influenza vaccination  - Flu Vaccine QUAD High Dose(Fluad)  4.  OSA on CPAP   5. Hyperglycemia  - Hemoglobin A1c  6. Need for pneumococcal vaccination  - Pneumococcal polysaccharide vaccine 23-valent greater than or equal to 2yo subcutaneous/IM  7. Colon cancer screening  - Cologuard  8. Class 1 obesity without serious comorbidity with body mass index (BMI) of 34.0 to 34.9 in adult, unspecified obesity type Counseled regarding prudent diet and regular exercise.   The entirety of the information documented in the History of Present Illness, Review of Systems and Physical Exam were personally obtained by me. Portions of this information were initially documented by Wilburt Finlay, CMA and reviewed by me for thoroughness and accuracy.      Lelon Huh, MD  [facility],

## 2019-05-17 LAB — COMPREHENSIVE METABOLIC PANEL
ALT: 15 IU/L (ref 0–44)
AST: 16 IU/L (ref 0–40)
Albumin/Globulin Ratio: 2.7 — ABNORMAL HIGH (ref 1.2–2.2)
Albumin: 4.6 g/dL (ref 3.8–4.8)
Alkaline Phosphatase: 58 IU/L (ref 39–117)
BUN/Creatinine Ratio: 18 (ref 10–24)
BUN: 21 mg/dL (ref 8–27)
Bilirubin Total: 0.4 mg/dL (ref 0.0–1.2)
CO2: 23 mmol/L (ref 20–29)
Calcium: 9.3 mg/dL (ref 8.6–10.2)
Chloride: 106 mmol/L (ref 96–106)
Creatinine, Ser: 1.2 mg/dL (ref 0.76–1.27)
GFR calc Af Amer: 72 mL/min/{1.73_m2} (ref >59)
GFR calc non Af Amer: 63 mL/min/{1.73_m2} (ref >59)
Globulin, Total: 1.7 g/dL (ref 1.5–4.5)
Glucose: 104 mg/dL — ABNORMAL HIGH (ref 65–99)
Potassium: 4.3 mmol/L (ref 3.5–5.2)
Sodium: 141 mmol/L (ref 134–144)
Total Protein: 6.3 g/dL (ref 6.0–8.5)

## 2019-05-17 LAB — LIPID PANEL
Chol/HDL Ratio: 3.8 ratio (ref 0.0–5.0)
Cholesterol, Total: 204 mg/dL — ABNORMAL HIGH (ref 100–199)
HDL: 53 mg/dL (ref >39)
LDL Chol Calc (NIH): 141 mg/dL — ABNORMAL HIGH (ref 0–99)
Triglycerides: 55 mg/dL (ref 0–149)
VLDL Cholesterol Cal: 10 mg/dL (ref 5–40)

## 2019-05-17 LAB — HEMOGLOBIN A1C
Est. average glucose Bld gHb Est-mCnc: 117 mg/dL
Hgb A1c MFr Bld: 5.7 % — ABNORMAL HIGH (ref 4.8–5.6)

## 2019-05-17 LAB — TSH: TSH: 3.92 u[IU]/mL (ref 0.450–4.500)

## 2019-05-25 ENCOUNTER — Other Ambulatory Visit: Payer: Self-pay | Admitting: Family Medicine

## 2019-06-16 LAB — COLOGUARD: Cologuard: NEGATIVE

## 2019-10-14 ENCOUNTER — Telehealth: Payer: Self-pay | Admitting: Family Medicine

## 2019-10-14 NOTE — Telephone Encounter (Signed)
Patient was seen in September with high blood pressure and cholesterol. He was supposed to be working on improving diet and follow up after 4 months.  Please advise is time to schedule office visit to recheck BP and lipids. Should be fasting so we can check cholesterol.

## 2019-10-14 NOTE — Telephone Encounter (Signed)
Patient scheduled for 11/07/2019.

## 2019-11-05 NOTE — Progress Notes (Signed)
Patient: [name] [name] Male    DOB: 15-Jul-1951   69 y.o.   MRN: [id number] Visit Date: 11/07/2019  Today's Provider: Lelon Huh, MD   Chief Complaint  Patient presents with  . Hypertension  . Hyperlipidemia   Subjective:     HPI  Hypertension, follow-up:  BP Readings from Last 3 Encounters:  11/07/19 (!) 150/96  05/16/19 (!) 142/88  04/23/19 (!) 161/101    He was last seen for hypertension 5 months ago.  BP at that visit was 142/88. Management since that visit includes counseling patient to strictly avoid sodium and saturated fats in his diet. He reports good compliance with treatment. He is not having side effects.  He is exercising. He is not adherent to low salt diet.   Outside blood pressures are averaging 120/80. He is experiencing none.  Patient denies chest pain, chest pressure/discomfort, claudication, dyspnea, exertional chest pressure/discomfort, fatigue, irregular heart beat, lower extremity edema, near-syncope, orthopnea, palpitations, paroxysmal nocturnal dyspnea, syncope and tachypnea.   Cardiovascular risk factors include advanced age (older than 36 for men, 61 for women), hypertension and male gender.  Use of agents associated with hypertension: thyroid hormones.     Weight trend: fluctuating a bit Wt Readings from Last 3 Encounters:  11/07/19 263 lb (119.3 kg)  05/16/19 266 lb (120.7 kg)  04/23/19 261 lb (118.4 kg)    Current diet: well balanced  ------------------------------------------------------------------------  Lipid/Cholesterol, Follow-up:   Last seen for this 5 months ago.  Management changes since that visit include counseling patient to strictly avoid sodium and saturated fats in his diet.. . Last Lipid Panel:    Component Value Date/Time   CHOL 204 (H) 05/16/2019 0916   TRIG 55 05/16/2019 0916   HDL 53 05/16/2019 0916   CHOLHDL 3.8 05/16/2019 0916   LDLCALC 141 (H) 05/16/2019 0916    Risk factors for vascular  disease include hypertension  He reports good compliance with treatment. He is not having side effects.  Current symptoms include none and have been stable. Weight trend: fluctuating a bit Prior visit with dietician: no Current diet: well balanced Current exercise: walking  Wt Readings from Last 3 Encounters:  11/07/19 263 lb (119.3 kg)  05/16/19 266 lb (120.7 kg)  04/23/19 261 lb (118.4 kg)    -------------------------------------------------------------------  Allergies  Allergen Reactions  . Fentanyl Itching  . Crestor  [Rosuvastatin Calcium]     Other reaction(s): Muscle Pain (severe)  . Ibuprofen   . Penicillins      Current Outpatient Medications:  .  levothyroxine (SYNTHROID) 50 MCG tablet, TAKE 1 TABLET BY MOUTH EVERY DAY, Disp: 30 tablet, Rfl: 12 .  losartan (COZAAR) 50 MG tablet, TAKE 1 TABLET BY MOUTH EVERY DAY, Disp: 90 tablet, Rfl: 4 .  Multiple Vitamins-Minerals (MULTIVITAMIN ADULT PO), Take 1 tablet by mouth daily., Disp: , Rfl:   Review of Systems  Constitutional: Negative for appetite change, chills and fever.  Respiratory: Negative for chest tightness, shortness of breath and wheezing.   Cardiovascular: Negative for chest pain and palpitations.  Gastrointestinal: Negative for abdominal pain, nausea and vomiting.  Musculoskeletal:       Knot on right thumb    Social History   Tobacco Use  . Smoking status: Never Smoker  . Smokeless tobacco: Never Used  Substance Use Topics  . Alcohol use: Yes    Alcohol/week: 0.0 standard drinks    Comment: moderate use; 8-9 drinks a week      Objective:  BP (!) 150/96 (BP Location: Right Arm, Cuff Size: Large)   Pulse 82   Temp (!) 96.9 F (36.1 C) (Temporal)   Wt 263 lb (119.3 kg)   SpO2 99% Comment: room air  BMI 33.77 kg/m  Vitals:   11/07/19 0834 11/07/19 0839  BP: (!) 152/92 (!) 150/96  Pulse: 82   Temp: (!) 96.9 F (36.1 C)   TempSrc: Temporal   SpO2: 99%   Weight: 263 lb (119.3 kg)     Body mass index is 33.77 kg/m.   Physical Exam   General: Appearance:    Obese male in no acute distress  Eyes:    PERRL, conjunctiva/corneas clear, EOM's intact       Lungs:     Clear to auscultation bilaterally, respirations unlabored  Heart:    Normal heart rate. Normal rhythm. No murmurs, rubs, or gallops.   MS:   All extremities are intact.   Neurologic:   Awake, alert, oriented x 3. No apparent focal neurological           defect.            Assessment & Plan    1. Essential hypertension Uncontrolled. Change from losartan 50mg  to - losartan-hydrochlorothiazide (HYZAAR) 100-12.5 MG tablet; Take 1 tablet by mouth daily.  Dispense: 90 tablet; Refill: 3  Future Appointments  Date Time Provider Department Center  02/13/2020 11:00 AM 02/15/2020, MD [facility]  04/22/2020 11:15 AM 04/24/2020, MD [facility] None       [name], MD  [facility]

## 2019-11-07 ENCOUNTER — Encounter: Payer: Self-pay | Admitting: Family Medicine

## 2019-11-07 ENCOUNTER — Other Ambulatory Visit: Payer: Self-pay

## 2019-11-07 ENCOUNTER — Ambulatory Visit (INDEPENDENT_AMBULATORY_CARE_PROVIDER_SITE_OTHER): Payer: Medicare Other | Admitting: Family Medicine

## 2019-11-07 VITALS — BP 150/96 | HR 82 | Temp 96.9°F | Wt 263.0 lb

## 2019-11-07 DIAGNOSIS — I10 Essential (primary) hypertension: Secondary | ICD-10-CM

## 2019-11-07 MED ORDER — LOSARTAN POTASSIUM-HCTZ 100-12.5 MG PO TABS: 1.0000 | ORAL_TABLET | Freq: Every day | ORAL | 3 refills | Status: DC

## 2019-12-29 NOTE — Progress Notes (Signed)
Subjective:   [name] [name] is a 69 y.o. male who presents for an Initial Medicare Annual Wellness Visit.    This visit is being conducted through telemedicine due to the COVID-19 pandemic. This patient has given me verbal consent via doximity to conduct this visit, patient states they are participating from their home address. Some vital signs may be absent or patient reported.    Patient identification: identified by name, DOB, and current address  Review of Systems  N/A  Cardiac Risk Factors include: advanced age (>35men, >100 women);male gender;hypertension    Objective:    Today's Vitals   12/30/19 1352  PainSc: 0-No pain   There is no height or weight on file to calculate BMI. Unable to obtain vitals due to visit being conducted via telephonically.   Advanced Directives 12/30/2019 06/07/2017 11/21/2015 11/20/2015 11/04/2015 03/30/2015  Does Patient Have a Medical Advance Directive? Yes No Yes Yes Yes Yes  Type of Estate agent of [place];Living will - Healthcare Power of [place];Living will Healthcare Power of [employer] of [place];Living will Healthcare Power of [place];Living will  Does patient want to make changes to medical advance directive? - - No - Patient declined - - No - Patient declined  Copy of Healthcare Power of Attorney in Chart? No - copy requested - - - - -    Current Medications (verified) Outpatient Encounter Medications as of 12/30/2019  Medication Sig  . levothyroxine (SYNTHROID) 50 MCG tablet TAKE 1 TABLET BY MOUTH EVERY DAY  . losartan-hydrochlorothiazide (HYZAAR) 100-12.5 MG tablet Take 1 tablet by mouth daily.  . Multiple Vitamins-Minerals (MULTIVITAMIN ADULT PO) Take 1 tablet by mouth daily.   No facility-administered encounter medications on file as of 12/30/2019.    Allergies (verified) Fentanyl, Crestor  [rosuvastatin calcium], Ibuprofen, and Penicillins   History: Past Medical History:  Diagnosis Date  .  Acute gallstone pancreatitis   . Arthritis   . Chronic kidney disease    stones  . Dysrhythmia   . GERD (gastroesophageal reflux disease)   . HOH (hard of hearing)   . Hypertension   . Hypothyroidism   . Prostate disease   . Sleep apnea    Past Surgical History:  Procedure Laterality Date  . ARM SKIN LESION BIOPSY / EXCISION    . CATARACT EXTRACTION W/PHACO Right 02/23/2015   Procedure: CATARACT EXTRACTION PHACO AND INTRAOCULAR LENS PLACEMENT (IOC);  Surgeon: Galen Manila, MD;  Location: [facility] ORS;  Service: Ophthalmology;  Laterality: Right;  [place] 01:01 AP%23.5 CDE 14.49 fluid pack lot #[id number]  . CATARACT EXTRACTION W/PHACO Left 03/30/2015   Procedure: CATARACT EXTRACTION PHACO AND INTRAOCULAR LENS PLACEMENT (IOC);  Surgeon: Galen Manila, MD;  Location: [facility] ORS;  Service: Ophthalmology;  Laterality: Left;  [place]: 01:08.3 AP%: 22.1 CDE: 15.12  Fluid Lot # [id number]  . CHOLECYSTECTOMY N/A 11/23/2015   Procedure: LAPAROSCOPIC CHOLECYSTECTOMY;  Surgeon: Gladis Riffle, MD;  Location: [facility] ORS;  Service: General;  Laterality: N/A;  . COLONOSCOPY    . ENDOSCOPIC RETROGRADE CHOLANGIOPANCREATOGRAPHY (ERCP) WITH PROPOFOL N/A 11/22/2015   Procedure: ENDOSCOPIC RETROGRADE CHOLANGIOPANCREATOGRAPHY (ERCP) WITH PROPOFOL;  Surgeon: Midge Minium, MD;  Location: [facility] ENDOSCOPY;  Service: Endoscopy;  Laterality: N/A;  . INCISION AND DRAINAGE     anal infection  . KIDNEY STONE SURGERY    . Right Ureterolithiasis with stent placement  04/29/2014   Dr. Sheppard Penton   Family History  Problem Relation Age of Onset  . Pancreatic cancer Mother   . Neuropathy Father   .  Throat cancer Father   . Lung cancer Father    Social History   Socioeconomic History  . Marital status: Married    Spouse name: Not on file  . Number of children: 3  . Years of education: Not on file  . Highest education level: Bachelor's degree (e.g., BA, AB, BS)  Occupational History  . 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 Smoking status: Never Smoker  . Smokeless tobacco: Never Used  Substance and Sexual Activity  . Alcohol use: Yes    Alcohol/week: 10.0 - 12.0 standard drinks    Types: 10 - 12 Glasses of wine per week  . Drug use: No  . Sexual activity: Not on file  Other Topics Concern  . Not on file  Social History Narrative  . Not on file   Social Determinants of Health   Financial Resource Strain: Low Risk   . Difficulty of Paying Living Expenses: Not hard at all  Food Insecurity: No Food Insecurity  . Worried About Charity fundraiser in the Last Year: Never true  . Ran Out of Food in the Last Year: Never true  Transportation Needs: No Transportation Needs  . Lack of Transportation (Medical): No  . Lack of Transportation (Non-Medical): No  Physical Activity: Sufficiently Active  . Days of Exercise per Week: 4 days  . Minutes of Exercise per Session: 60 min  Stress: No Stress Concern Present  . Feeling of Stress : Not at all  Social Connections: Slightly Isolated  . Frequency of Communication with Friends and Family: More than three times a week  . Frequency of Social Gatherings with Friends and Family: More than three times a week  . Attends Religious Services: Never  . Active Member of Clubs or Organizations: Yes  . Attends Archivist Meetings: More than 4 times per year  . Marital Status: Married   Tobacco Counseling Counseling given: Not Answered   Clinical Intake:  Pre-visit preparation completed: Yes  Pain : No/denies pain Pain Score: 0-No pain     Nutritional Risks: None Diabetes: No  How often do you need to have someone help you when you read instructions, pamphlets, or other written materials from your doctor or pharmacy?: 1 - Never  Interpreter Needed?: No  Information entered by :: [facility], LPN  Activities of Daily Living In your present state of health, do you have any difficulty performing the  following activities: 12/30/2019 11/07/2019  Hearing? N N  Vision? N N  Difficulty concentrating or making decisions? N Y  Walking or climbing stairs? N N  Dressing or bathing? N N  Doing errands, shopping? N N  Preparing Food and eating ? N -  Using the Toilet? N -  In the past six months, have you accidently leaked urine? N -  Do you have problems with loss of bowel control? N -  Managing your Medications? N -  Managing your Finances? N -  Housekeeping or managing your Housekeeping? N -  Some recent data might be hidden     Immunizations and Health Maintenance Immunization History  Administered Date(s) Administered  . Fluad Quad(high Dose 65+) 05/16/2019  . Influenza, High Dose Seasonal PF 05/29/2017  . Influenza-Unspecified 06/13/2015  . Pneumococcal Conjugate-13 05/29/2017  . Pneumococcal Polysaccharide-23 05/16/2019  . Tdap 01/21/2010, 06/07/2017  . Zoster 09/14/2011   Health Maintenance Due  Topic Date Due  . COVID-19 Vaccine (1) Never done  Patient Care Team: Malva Limes, MD as PCP - General (Family Medicine) Milagros Evener, MD as Consulting Physician (Psychiatry) [name], MD as Consulting Physician (Urology)  Indicate any recent Medical Services you may have received from other than [facility] providers in the past year (date may be approximate).    Assessment:   This is a routine wellness examination for [place].  Hearing/Vision screen No exam data present  Dietary issues and exercise activities discussed: Current Exercise Habits: Home exercise routine, Type of exercise: walking, Time (Minutes): 60, Frequency (Times/Week): 4, Weekly Exercise (Minutes/Week): 240, Intensity: Mild, Exercise limited by: None identified  Goals    . DIET - INCREASE WATER INTAKE     Recommend to drink at least 6-8 8oz glasses of water per day.      Depression Screen PHQ 2/9 Scores 11/07/2019 05/29/2017 12/17/2015  PHQ - 2 Score 0 0 0  PHQ- 9 Score - - 4    Fall  Risk Fall Risk  12/30/2019 11/07/2019 05/29/2017 12/17/2015  Falls in the past year? 0 0 No No  Number falls in past yr: 0 0 - -  Injury with Fall? 0 0 - -  Follow up - Falls evaluation completed - -    FALL RISK PREVENTION PERTAINING TO THE HOME:  Any stairs in or around the home? Yes  If so, are there any without handrails? No   Home free of loose throw rugs in walkways, pet beds, electrical cords, etc? Yes  Adequate lighting in your home to reduce risk of falls? Yes   ASSISTIVE DEVICES UTILIZED TO PREVENT FALLS:  Life alert? No  Use of a cane, walker or w/c? No  Grab bars in the bathroom? No  Shower chair or bench in shower? Yes  Elevated toilet seat or a handicapped toilet? Yes    TIMED UP AND GO:  Was the test performed? No .    Cognitive Function:     6CIT Screen 12/30/2019 05/29/2017  What Year? 0 points 0 points  What month? 0 points 0 points  What time? 0 points 0 points  Count back from 20 0 points 0 points  Months in reverse 0 points 0 points  Repeat phrase 0 points 0 points  Total Score 0 0    Screening Tests Health Maintenance  Topic Date Due  . COVID-19 Vaccine (1) Never done  . INFLUENZA VACCINE  03/28/2020  . Fecal DNA (Cologuard)  06/08/2022  . TETANUS/TDAP  06/08/2027  . Hepatitis C Screening  Completed  . PNA vac Low Risk Adult  Completed    Qualifies for Shingles Vaccine? Yes  Zostavax completed 09/14/11. Due for Shingrix. Pt has been advised to call insurance company to determine out of pocket expense. Advised may also receive vaccine at local pharmacy or Health Dept. Verbalized acceptance and understanding.  Tdap: Up to date  Flu Vaccine: Up to date  Pneumococcal Vaccine: Completed series  Cancer Screenings:  Colorectal Screening: Cologuard completed 06/09/19. Repeat every 3 years.   Lung Cancer Screening: (Low Dose CT Chest recommended if Age 79-80 years, 30 pack-year currently smoking OR have quit w/in 15years.) does not qualify.    Additional Screening:  Hepatitis C Screening: Up to date  Vision Screening: Recommended annual ophthalmology exams for early detection of glaucoma and other disorders of the eye.  Dental Screening: Recommended annual dental exams for proper oral hygiene  Community Resource Referral:  CRR required this visit?  No        Plan:  I have personally reviewed and addressed the Medicare Annual Wellness questionnaire and have noted the following in the patient's chart:  A. Medical and social history B. Use of alcohol, tobacco or illicit drugs  C. Current medications and supplements D. Functional ability and status E.  Nutritional status F.  Physical activity G. Advance directives H. List of other physicians I.  Hospitalizations, surgeries, and ER visits in previous 12 months J.  Vitals K. Screenings such as hearing and vision if needed, cognitive and depression L. Referrals and appointments   In addition, I have reviewed and discussed with patient certain preventive protocols, quality metrics, and best practice recommendations. A written personalized care plan for preventive services as well as general preventive health recommendations were provided to patient.   Darrick Huntsman, LPN   11/27/6832  Nurse Health Advisor   Nurse Notes: None.

## 2019-12-30 ENCOUNTER — Ambulatory Visit (INDEPENDENT_AMBULATORY_CARE_PROVIDER_SITE_OTHER): Payer: Medicare Other

## 2019-12-30 ENCOUNTER — Other Ambulatory Visit: Payer: Self-pay

## 2019-12-30 DIAGNOSIS — Z0000 Encounter for general adult medical examination without abnormal findings: Secondary | ICD-10-CM | POA: Diagnosis not present

## 2019-12-30 NOTE — Patient Instructions (Signed)
[name] [name] , Thank you for taking time to come for your Medicare Wellness Visit. I appreciate your ongoing commitment to your health goals. Please review the following plan we discussed and let me know if I can assist you in the future.   Screening recommendations/referrals: Colonoscopy: Cologuard up to date, due 05/2022 Recommended yearly ophthalmology/optometry visit for glaucoma screening and checkup Recommended yearly dental visit for hygiene and checkup  Vaccinations: Influenza vaccine: Up to date Pneumococcal vaccine: Completed series Tdap vaccine: Up to date Shingles vaccine: Pt declines today.     Advanced directives: Please bring a copy of your POA (Power of Attorney) and/or Living Will to your next appointment.   Conditions/risks identified: Recommend to drink at least 6-8 8oz glasses of water per day.  Next appointment: 02/13/20 @ 11:00 AM with Dr Sherrie Mustache   Preventive Care 65 Years and Older, Male Preventive care refers to lifestyle choices and visits with your health care provider that can promote health and wellness. What does preventive care include?  A yearly physical exam. This is also called an annual well check.  Dental exams once or twice a year.  Routine eye exams. Ask your health care provider how often you should have your eyes checked.  Personal lifestyle choices, including:  Daily care of your teeth and gums.  Regular physical activity.  Eating a healthy diet.  Avoiding tobacco and drug use.  Limiting alcohol use.  Practicing safe sex.  Taking low doses of aspirin every day.  Taking vitamin and mineral supplements as recommended by your health care provider. What happens during an annual well check? The services and screenings done by your health care provider during your annual well check will depend on your age, overall health, lifestyle risk factors, and family history of disease. Counseling  Your health care provider may ask you questions  about your:  Alcohol use.  Tobacco use.  Drug use.  Emotional well-being.  Home and relationship well-being.  Sexual activity.  Eating habits.  History of falls.  Memory and ability to understand (cognition).  Work and work Astronomer. Screening  You may have the following tests or measurements:  Height, weight, and BMI.  Blood pressure.  Lipid and cholesterol levels. These may be checked every 5 years, or more frequently if you are over 43 years old.  Skin check.  Lung cancer screening. You may have this screening every year starting at age 24 if you have a 30-pack-year history of smoking and currently smoke or have quit within the past 15 years.  Fecal occult blood test (FOBT) of the stool. You may have this test every year starting at age 69.  Flexible sigmoidoscopy or colonoscopy. You may have a sigmoidoscopy every 5 years or a colonoscopy every 10 years starting at age 37.  Prostate cancer screening. Recommendations will vary depending on your family history and other risks.  Hepatitis C blood test.  Hepatitis B blood test.  Sexually transmitted disease (STD) testing.  Diabetes screening. This is done by checking your blood sugar (glucose) after you have not eaten for a while (fasting). You may have this done every 1-3 years.  Abdominal aortic aneurysm (AAA) screening. You may need this if you are a current or former smoker.  Osteoporosis. You may be screened starting at age 27 if you are at high risk. Talk with your health care provider about your test results, treatment options, and if necessary, the need for more tests. Vaccines  Your health care provider may recommend  certain vaccines, such as:  Influenza vaccine. This is recommended every year.  Tetanus, diphtheria, and acellular pertussis (Tdap, Td) vaccine. You may need a Td booster every 10 years.  Zoster vaccine. You may need this after age 34.  Pneumococcal 13-valent conjugate (PCV13)  vaccine. One dose is recommended after age 34.  Pneumococcal polysaccharide (PPSV23) vaccine. One dose is recommended after age 17. Talk to your health care provider about which screenings and vaccines you need and how often you need them. This information is not intended to replace advice given to you by your health care provider. Make sure you discuss any questions you have with your health care provider. Document Released: 09/10/2015 Document Revised: 05/03/2016 Document Reviewed: 06/15/2015 Elsevier Interactive Patient Education  2017 [place] Prevention in the Home Falls can cause injuries. They can happen to people of all ages. There are many things you can do to make your home safe and to help prevent falls. What can I do on the outside of my home?  Regularly fix the edges of walkways and driveways and fix any cracks.  Remove anything that might make you trip as you walk through a door, such as a raised step or threshold.  Trim any bushes or trees on the path to your home.  Use bright outdoor lighting.  Clear any walking paths of anything that might make someone trip, such as rocks or tools.  Regularly check to see if handrails are loose or broken. Make sure that both sides of any steps have handrails.  Any raised decks and porches should have guardrails on the edges.  Have any leaves, snow, or ice cleared regularly.  Use sand or salt on walking paths during winter.  Clean up any spills in your garage right away. This includes oil or grease spills. What can I do in the bathroom?  Use night lights.  Install grab bars by the toilet and in the tub and shower. Do not use towel bars as grab bars.  Use non-skid mats or decals in the tub or shower.  If you need to sit down in the shower, use a plastic, non-slip stool.  Keep the floor dry. Clean up any water that spills on the floor as soon as it happens.  Remove soap buildup in the tub or shower regularly.   Attach bath mats securely with double-sided non-slip rug tape.  Do not have throw rugs and other things on the floor that can make you trip. What can I do in the bedroom?  Use night lights.  Make sure that you have a light by your bed that is easy to reach.  Do not use any sheets or blankets that are too big for your bed. They should not hang down onto the floor.  Have a firm chair that has side arms. You can use this for support while you get dressed.  Do not have throw rugs and other things on the floor that can make you trip. What can I do in the kitchen?  Clean up any spills right away.  Avoid walking on wet floors.  Keep items that you use a lot in easy-to-reach places.  If you need to reach something above you, use a strong step stool that has a grab bar.  Keep electrical cords out of the way.  Do not use floor polish or wax that makes floors slippery. If you must use wax, use non-skid floor wax.  Do not have throw rugs and other  things on the floor that can make you trip. What can I do with my stairs?  Do not leave any items on the stairs.  Make sure that there are handrails on both sides of the stairs and use them. Fix handrails that are broken or loose. Make sure that handrails are as long as the stairways.  Check any carpeting to make sure that it is firmly attached to the stairs. Fix any carpet that is loose or worn.  Avoid having throw rugs at the top or bottom of the stairs. If you do have throw rugs, attach them to the floor with carpet tape.  Make sure that you have a light switch at the top of the stairs and the bottom of the stairs. If you do not have them, ask someone to add them for you. What else can I do to help prevent falls?  Wear shoes that:  Do not have high heels.  Have rubber bottoms.  Are comfortable and fit you well.  Are closed at the toe. Do not wear sandals.  If you use a stepladder:  Make sure that it is fully opened. Do not climb  a closed stepladder.  Make sure that both sides of the stepladder are locked into place.  Ask someone to hold it for you, if possible.  Clearly mark and make sure that you can see:  Any grab bars or handrails.  First and last steps.  Where the edge of each step is.  Use tools that help you move around (mobility aids) if they are needed. These include:  Canes.  Walkers.  Scooters.  Crutches.  Turn on the lights when you go into a dark area. Replace any light bulbs as soon as they burn out.  Set up your furniture so you have a clear path. Avoid moving your furniture around.  If any of your floors are uneven, fix them.  If there are any pets around you, be aware of where they are.  Review your medicines with your doctor. Some medicines can make you feel dizzy. This can increase your chance of falling. Ask your doctor what other things that you can do to help prevent falls. This information is not intended to replace advice given to you by your health care provider. Make sure you discuss any questions you have with your health care provider. Document Released: 06/10/2009 Document Revised: 01/20/2016 Document Reviewed: 09/18/2014 Elsevier Interactive Patient Education  2017 [employer].

## 2020-02-11 NOTE — Progress Notes (Addendum)
Established patient visit   Patient: [name] [name]   DOB: 12-27-1950   69 y.o. Male  MRN: [id number] Visit Date: 02/13/2020  Today's healthcare provider: Mila Merry, MD   Chief Complaint  Patient presents with  . Hypertension   I,Latasha Walston,acting as a scribe for Mila Merry, MD.,have documented all relevant documentation on the behalf of [name] Vosler, MD,as directed by  Mila Merry, MD while in the presence of Mila Merry, MD.  Subjective    HPI Hypertension, follow-up  BP Readings from Last 3 Encounters:  02/13/20 132/79  11/07/19 (!) 150/96  05/16/19 (!) 142/88   Wt Readings from Last 3 Encounters:  02/13/20 262 lb 3.2 oz (118.9 kg)  11/07/19 263 lb (119.3 kg)  05/16/19 266 lb (120.7 kg)     He was last seen for hypertension 3 months ago.  BP at that visit was 150/96. Management since that visit includes changing from Losartan 50mg  to - Losartan-hydrochlorothiazide (HYZAAR) 100-12.5 MG .  He reports good compliance with treatment. He is having side effects. He is feeling more fatigued. Feels like it is more difficult to urinate. Feels achy off and on and has occasional brief flank pain.  He is following a Regular diet. He is exercising. He does not smoke.  Use of agents associated with hypertension: thyroid hormones.   Outside blood pressures are being checked at home. Symptoms: No chest pain No chest pressure  No palpitations No syncope  No dyspnea No orthopnea  No paroxysmal nocturnal dyspnea No lower extremity edema   Pertinent labs: Lab Results  Component Value Date   CHOL 204 (H) 05/16/2019   HDL 53 05/16/2019   LDLCALC 141 (H) 05/16/2019   TRIG 55 05/16/2019   CHOLHDL 3.8 05/16/2019   Lab Results  Component Value Date   NA 141 05/16/2019   K 4.3 05/16/2019   CREATININE 1.20 05/16/2019   GFRNONAA 63 05/16/2019   GFRAA 72 05/16/2019   GLUCOSE 104 (H) 05/16/2019     The 10-year ASCVD risk score 05/18/2019 DC Jr., et al., 2013) is:  17.6%   ---------------------------------------------------------------------------------------------------   Medications: Outpatient Medications Prior to Visit  Medication Sig  . levothyroxine (SYNTHROID) 50 MCG tablet TAKE 1 TABLET BY MOUTH EVERY DAY  . losartan-hydrochlorothiazide (HYZAAR) 100-12.5 MG tablet Take 1 tablet by mouth daily.  . Multiple Vitamins-Minerals (MULTIVITAMIN ADULT PO) Take 1 tablet by mouth daily.   No facility-administered medications prior to visit.    Review of Systems  Constitutional: Negative for appetite change, chills and fever.  Respiratory: Negative for chest tightness, shortness of breath and wheezing.   Cardiovascular: Negative for chest pain and palpitations.  Gastrointestinal: Negative for abdominal pain, nausea and vomiting.     Objective    BP 132/79 (BP Location: Right Arm, Patient Position: Sitting, Cuff Size: Large)   Pulse (!) 108   Temp (!) 97.1 F (36.2 C) (Temporal)   Wt 262 lb 3.2 oz (118.9 kg)   BMI 33.66 kg/m   Physical Exam   General appearance: Obese male, cooperative and in no acute distress Head: Normocephalic, without obvious abnormality, atraumatic Respiratory: Respirations even and unlabored, normal respiratory rate Extremities: All extremities are intact.  Skin: Skin color, texture, turgor normal. No rashes seen  Psych: Appropriate mood and affect. Neurologic: Mental status: Alert, oriented to person, place, and time, thought content appropriate.     Assessment & Plan     1. Essential hypertension BP much better since addition of hctz to losartan,  but is have several mild side effects.  - Renal function panel - Magnesium - Uric acid  2. Hypothyroidism, unspecified type Due to check - TSH  3. Hyperglycemia Due to check - Hemoglobin A1c  4. OSA on CPAP Patient reports that he is using CPAP every night and sleeping all night long with it. His energy has greatly improved since being on CPAP and wakes  up refreshed every day when he uses it, and is medically benefiting from its use. His CPAP machine is working, but supplies are worn and  is in need of new supplies for his machine.  No follow-ups on file.      The entirety of the information documented in the History of Present Illness, Review of Systems and Physical Exam were personally obtained by me. Portions of this information were initially documented by the CMA and reviewed by me for thoroughness and accuracy.      Lelon Huh, MD  [facility] [phone] (phone) [phone] (fax)  [place]

## 2020-02-13 ENCOUNTER — Encounter: Payer: Self-pay | Admitting: Family Medicine

## 2020-02-13 ENCOUNTER — Other Ambulatory Visit: Payer: Self-pay

## 2020-02-13 ENCOUNTER — Ambulatory Visit (INDEPENDENT_AMBULATORY_CARE_PROVIDER_SITE_OTHER): Payer: Medicare Other | Admitting: Family Medicine

## 2020-02-13 VITALS — BP 132/79 | HR 108 | Temp 97.1°F | Wt 262.2 lb

## 2020-02-13 DIAGNOSIS — E039 Hypothyroidism, unspecified: Secondary | ICD-10-CM

## 2020-02-13 DIAGNOSIS — R739 Hyperglycemia, unspecified: Secondary | ICD-10-CM

## 2020-02-13 DIAGNOSIS — I10 Essential (primary) hypertension: Secondary | ICD-10-CM | POA: Diagnosis not present

## 2020-02-14 LAB — RENAL FUNCTION PANEL
Albumin: 4.7 g/dL (ref 3.8–4.8)
BUN/Creatinine Ratio: 23 (ref 10–24)
BUN: 26 mg/dL (ref 8–27)
CO2: 22 mmol/L (ref 20–29)
Calcium: 9.6 mg/dL (ref 8.6–10.2)
Chloride: 104 mmol/L (ref 96–106)
Creatinine, Ser: 1.14 mg/dL (ref 0.76–1.27)
GFR calc Af Amer: 77 mL/min/{1.73_m2} (ref >59)
GFR calc non Af Amer: 66 mL/min/{1.73_m2} (ref >59)
Glucose: 110 mg/dL — ABNORMAL HIGH (ref 65–99)
Phosphorus: 2.8 mg/dL (ref 2.8–4.1)
Potassium: 4.7 mmol/L (ref 3.5–5.2)
Sodium: 142 mmol/L (ref 134–144)

## 2020-02-14 LAB — HEMOGLOBIN A1C
Est. average glucose Bld gHb Est-mCnc: 114 mg/dL
Hgb A1c MFr Bld: 5.6 % (ref 4.8–5.6)

## 2020-02-14 LAB — URIC ACID: Uric Acid: 7.8 mg/dL (ref 3.8–8.4)

## 2020-02-14 LAB — MAGNESIUM: Magnesium: 2 mg/dL (ref 1.6–2.3)

## 2020-02-14 LAB — TSH: TSH: 2.65 u[IU]/mL (ref 0.450–4.500)

## 2020-03-22 ENCOUNTER — Telehealth: Payer: Self-pay

## 2020-03-22 NOTE — Telephone Encounter (Signed)
FYI

## 2020-03-22 NOTE — Telephone Encounter (Signed)
Copied from CRM [id number]. Topic: General - Other >> Mar 22, 2020 10:09 AM Leafy Ro wrote: Reason for CRM: [name] [name] with [employer] company will be faxing form for the pt cpap machine and supplies. Corrie Dandy also needs office notes from 04-28-2017 and forward.

## 2020-03-23 ENCOUNTER — Telehealth: Payer: Self-pay | Admitting: Family Medicine

## 2020-03-23 NOTE — Telephone Encounter (Signed)
Received an order request for new CPAP machine from [employer]. Please verify with patient if this is a legitimated request, the reason for the new CPAP, and whether and how often he is using his current CPAP machine.

## 2020-03-23 NOTE — Telephone Encounter (Signed)
I called and spoke with patient. He states he doesn't not need a new CPAP machine. He says he just needs the CPAP supplies (headgear, hose and water tank). Patient says his CPAP machine works fine but its been 2 years since he replaced the supplies. Patient says that [employer] company told him that they needed an order from his doctor stating that he still needs CPAP therapy so that they can bill to his new insurance Medicare. His former insurance was [employer], but since he had new insurance they need an order from patients doctor saying the he still needs CPAP therapy. Patient says he uses his CPAP machine every day and sleeps better with it. He has had the same machine for 8-10 years now, and it works just fine.

## 2020-04-22 ENCOUNTER — Ambulatory Visit: Payer: Medicare Other | Admitting: Urology

## 2020-04-30 NOTE — Progress Notes (Signed)
05/04/2020 4:47 PM   [name] [name] Nov 12, 1950 [id number]  Referring provider: Malva Limes, MD [address] [place],  [place] [address] Chief Complaint  Patient presents with  . Elevated PSA    HPI: [name] [name] is a 69 y.o. male who presents today for an annual follow up of BPH associated with nocturia, elevated PSA, combined arterial insufficiency and corporo-venous occlusive erectile dysfunction and history of kidney stones.   Kidney stones Nephrolithiasis and obstructing stone in 2015 requiring ureteroscopy. He also 24 urinalysis done last year that showed hypocitraturia and low urine output. He was recommended to start potassium citrate at that time, but he did not want to start a new medication.  KUB 03/07/2016 shows no obvious stone but overlying bowel gas.No interval imaging since that time.  Denies ant recent stone episodes.   Erectile dysfunction He has a history of erectile dysfunction.Previously did well on Cialis but had acid reflux related to this.  He has a prescription for generic Viagra currently but this is also causing reflux.  He is not using it currently.  He and his wife have found other ways for satisfactory sexual function.  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  BPH / history of elevated PSA PSAfluctuation,most recent PSA 5.0 on 04/23/2019,as high as 6.6 in 2016. Bx 08/05/16 consistent with chronic inflammation TRUS volume 93 g  His nocturia has improved from x 4-5 to once nightly. He reports having less stress. He has no further complaints.   He is more physically active and would like to lose weight. He recently changed his BP medication and notes that he has frequency in the mornings.   PMH: Past Medical History:  Diagnosis Date  . Acute gallstone pancreatitis   . Arthritis   . Chronic kidney disease    stones  . Dysrhythmia   . GERD (gastroesophageal reflux disease)   . HOH (hard  of hearing)   . Hypertension   . Hypothyroidism   . Prostate disease   . Sleep apnea     Surgical History: Past Surgical History:  Procedure Laterality Date  . ARM SKIN LESION BIOPSY / EXCISION    . CATARACT EXTRACTION W/PHACO Right 02/23/2015   Procedure: CATARACT EXTRACTION PHACO AND INTRAOCULAR LENS PLACEMENT (IOC);  Surgeon: Galen Manila, MD;  Location: [facility] ORS;  Service: Ophthalmology;  Laterality: Right;  [place] 01:01 AP%23.5 CDE 14.49 fluid pack lot #[id number]  . CATARACT EXTRACTION W/PHACO Left 03/30/2015   Procedure: CATARACT EXTRACTION PHACO AND INTRAOCULAR LENS PLACEMENT (IOC);  Surgeon: Galen Manila, MD;  Location: [facility] ORS;  Service: Ophthalmology;  Laterality: Left;  [place]: 01:08.3 AP%: 22.1 CDE: 15.12  Fluid Lot # [id number]  . CHOLECYSTECTOMY N/A 11/23/2015   Procedure: LAPAROSCOPIC CHOLECYSTECTOMY;  Surgeon: Gladis Riffle, MD;  Location: [facility] ORS;  Service: General;  Laterality: N/A;  . COLONOSCOPY    . ENDOSCOPIC RETROGRADE CHOLANGIOPANCREATOGRAPHY (ERCP) WITH PROPOFOL N/A 11/22/2015   Procedure: ENDOSCOPIC RETROGRADE CHOLANGIOPANCREATOGRAPHY (ERCP) WITH PROPOFOL;  Surgeon: Midge Minium, MD;  Location: [facility] ENDOSCOPY;  Service: Endoscopy;  Laterality: N/A;  . INCISION AND DRAINAGE     anal infection  . KIDNEY STONE SURGERY    . Right Ureterolithiasis with stent placement  04/29/2014   Dr. Sheppard Penton    Home Medications:  Allergies as of 05/04/2020      Reactions   Fentanyl Itching   Crestor  [rosuvastatin Calcium]    Other reaction(s): Muscle Pain (severe)   Ibuprofen  Penicillins       Medication List       Accurate as of May 04, 2020  4:47 PM. If you have any questions, ask your nurse or doctor.        levothyroxine 50 MCG tablet Commonly known as: SYNTHROID TAKE 1 TABLET BY MOUTH EVERY DAY   losartan-hydrochlorothiazide 100-12.5 MG tablet Commonly known as: HYZAAR Take 1 tablet by mouth daily.   MULTIVITAMIN ADULT PO Take 1 tablet by  mouth daily.       Allergies:  Allergies  Allergen Reactions  . Fentanyl Itching  . Crestor  [Rosuvastatin Calcium]     Other reaction(s): Muscle Pain (severe)  . Ibuprofen   . Penicillins     Family History: Family History  Problem Relation Age of Onset  . Pancreatic cancer Mother   . Neuropathy Father   . Throat cancer Father   . Lung cancer Father     Social History:  reports that he has never smoked. He has never used smokeless tobacco. He reports current alcohol use of about 10.0 - 12.0 standard drinks of alcohol per week. He reports that he does not use drugs.   Physical Exam: BP 125/75   Pulse (!) 114   Ht 6\' 2"  (1.88 m)   Wt 258 lb (117 kg)   BMI 33.13 kg/m   Constitutional:  Alert and oriented, No acute distress. HEENT: [place] AT, moist mucus membranes.  Trachea midline, no masses. Cardiovascular: No clubbing, cyanosis, or edema. Respiratory: Normal respiratory effort, no increased work of breathing. Rectal: Normal sphincter tone, enlarged prostate, non-tender no nodules  Skin: No rashes, bruises or suspicious lesions. Neurologic: Grossly intact, no focal deficits, moving all 4 extremities. Psychiatric: Normal mood and affect.  Laboratory Data:  Lab Results  Component Value Date   CREATININE 1.14 02/13/2020    Lab Results  Component Value Date   HGBA1C 5.6 02/13/2020     Assessment & Plan:    1. BPH associated with nocturia Symptomatically stable; nocturia has improved from x4-5 nightly to x1 nightly.  Previously did not tolerate Flomax well  Overall, doing well  2. Elevated PSA Personal history of fluctuating/elevated PSA We will continue to follow him annually until his age 51 Recent PSA was 5.0 on 04/23/2019 PSA today. Will call with results.   3. Combined arterial insufficiency and corporo-venous occlusive erectile dysfunction Continue sildenafil as needed  4. History of kidney stones  Asymptomatic   RTC in 1 year with  PSA/DRE/PVR/IPSS  [facility] [address], [address] [place], [place] [phone]  I, [phone], am acting as a scribe for Dr. Theador Hawthorne.  I have reviewed the above documentation for accuracy and completeness, and I agree with the above.   [name], MD

## 2020-05-04 ENCOUNTER — Encounter: Payer: Self-pay | Admitting: Urology

## 2020-05-04 ENCOUNTER — Other Ambulatory Visit: Payer: Self-pay

## 2020-05-04 ENCOUNTER — Ambulatory Visit (INDEPENDENT_AMBULATORY_CARE_PROVIDER_SITE_OTHER): Payer: Medicare Other | Admitting: Urology

## 2020-05-04 VITALS — BP 125/75 | HR 114 | Ht 74.0 in | Wt 258.0 lb

## 2020-05-04 DIAGNOSIS — R972 Elevated prostate specific antigen [PSA]: Secondary | ICD-10-CM

## 2020-05-05 ENCOUNTER — Telehealth: Payer: Self-pay

## 2020-05-05 LAB — PSA: Prostate Specific Ag, Serum: 4.8 ng/mL — ABNORMAL HIGH (ref 0.0–4.0)

## 2020-05-05 NOTE — Telephone Encounter (Signed)
-----   Message from [name], MD sent at 05/05/2020  8:08 AM EDT ----- PSA is stable, 4.8  [name], MD

## 2020-05-05 NOTE — Telephone Encounter (Signed)
Pt aware of results 

## 2020-05-23 ENCOUNTER — Other Ambulatory Visit: Payer: Self-pay | Admitting: Family Medicine

## 2020-05-23 NOTE — Telephone Encounter (Signed)
Requested Prescriptions  Pending Prescriptions Disp Refills  . levothyroxine (SYNTHROID) 50 MCG tablet [Pharmacy Med Name: LEVOTHYROXINE 50 MCG TABLET] 90 tablet 2    Sig: TAKE 1 TABLET BY MOUTH EVERY DAY     Endocrinology:  Hypothyroid Agents Failed - 05/23/2020 12:51 AM      Failed - TSH needs to be rechecked within 3 months after an abnormal result. Refill until TSH is due.      Passed - TSH in normal range and within 360 days    TSH  Date Value Ref Range Status  02/13/2020 2.650 0.450 - 4.500 uIU/mL Final         Passed - Valid encounter within last 12 months    Recent Outpatient Visits          3 months ago Essential hypertension   [facility] Malva Limes, MD   6 months ago Essential hypertension   [facility] Malva Limes, MD   1 year ago Essential hypertension   [facility] Malva Limes, MD   2 years ago Laceration of left middle finger without foreign body, nail damage status unspecified, initial encounter   [facility] Malva Limes, MD   2 years ago Annual physical exam   [facility] Malva Limes, MD      Future Appointments            In 11 months [name], MD [facility]

## 2020-06-25 ENCOUNTER — Ambulatory Visit
Admission: EM | Admit: 2020-06-25 | Discharge: 2020-06-25 | Disposition: A | Discharge status: Home / Self Care | Payer: Medicare Other | Attending: Family Medicine | Admitting: Family Medicine

## 2020-06-25 ENCOUNTER — Ambulatory Visit: Payer: Medicare Other | Admitting: Family Medicine

## 2020-06-25 ENCOUNTER — Ambulatory Visit: Payer: Self-pay

## 2020-06-25 DIAGNOSIS — M25522 Pain in left elbow: Secondary | ICD-10-CM

## 2020-06-25 DIAGNOSIS — M25422 Effusion, left elbow: Secondary | ICD-10-CM | POA: Diagnosis not present

## 2020-06-25 DIAGNOSIS — L03114 Cellulitis of left upper limb: Secondary | ICD-10-CM | POA: Diagnosis not present

## 2020-06-25 MED ORDER — DOXYCYCLINE HYCLATE 100 MG PO CAPS: 100.0000 mg | ORAL_CAPSULE | Freq: Two times a day (BID) | ORAL | 0 refills | Status: DC

## 2020-06-25 NOTE — ED Provider Notes (Signed)
[facility] CARE CENTER   [id number] 06/25/20 Arrival Time: 1043  CC: RASH  SUBJECTIVE:  [name] [name] is a 69 y.o. male who presents with a skin complaint that began 2 days ago. Reports redness, swelling, pain to the L elbow and upper arm. Reports that he has abscesses that occur intermittently to the L elbow. Denies precipitating event or trauma. There are no aggravating or alleviating factors. Denies fever, chills, nausea, vomiting, discharge, oral lesions, SOB, chest pain, abdominal pain, changes in bowel or bladder function.    ROS: As per HPI.  All other pertinent ROS negative.     Past Medical History:  Diagnosis Date  . Acute gallstone pancreatitis   . Arthritis   . Chronic kidney disease    stones  . Dysrhythmia   . GERD (gastroesophageal reflux disease)   . HOH (hard of hearing)   . Hypertension   . Hypothyroidism   . Prostate disease   . Sleep apnea    Past Surgical History:  Procedure Laterality Date  . ARM SKIN LESION BIOPSY / EXCISION    . CATARACT EXTRACTION W/PHACO Right 02/23/2015   Procedure: CATARACT EXTRACTION PHACO AND INTRAOCULAR LENS PLACEMENT (IOC);  Surgeon: Galen Manila, MD;  Location: [facility] ORS;  Service: Ophthalmology;  Laterality: Right;  [place] 01:01 AP%23.5 CDE 14.49 fluid pack lot #[id number]  . CATARACT EXTRACTION W/PHACO Left 03/30/2015   Procedure: CATARACT EXTRACTION PHACO AND INTRAOCULAR LENS PLACEMENT (IOC);  Surgeon: Galen Manila, MD;  Location: [facility] ORS;  Service: Ophthalmology;  Laterality: Left;  [place]: 01:08.3 AP%: 22.1 CDE: 15.12  Fluid Lot # [id number]  . CHOLECYSTECTOMY N/A 11/23/2015   Procedure: LAPAROSCOPIC CHOLECYSTECTOMY;  Surgeon: Gladis Riffle, MD;  Location: [facility] ORS;  Service: General;  Laterality: N/A;  . COLONOSCOPY    . ENDOSCOPIC RETROGRADE CHOLANGIOPANCREATOGRAPHY (ERCP) WITH PROPOFOL N/A 11/22/2015   Procedure: ENDOSCOPIC RETROGRADE CHOLANGIOPANCREATOGRAPHY (ERCP) WITH PROPOFOL;  Surgeon: Midge Minium, MD;  Location:  [facility] ENDOSCOPY;  Service: Endoscopy;  Laterality: N/A;  . INCISION AND DRAINAGE     anal infection  . KIDNEY STONE SURGERY    . Right Ureterolithiasis with stent placement  04/29/2014   Dr. Sheppard Penton   Allergies  Allergen Reactions  . Fentanyl Itching  . Crestor  [Rosuvastatin Calcium]     Other reaction(s): Muscle Pain (severe)  . Ibuprofen   . Penicillins    No current facility-administered medications on file prior to encounter.   Current Outpatient Medications on File Prior to Encounter  Medication Sig Dispense Refill  . levothyroxine (SYNTHROID) 50 MCG tablet TAKE 1 TABLET BY MOUTH EVERY DAY 90 tablet 2  . losartan-hydrochlorothiazide (HYZAAR) 100-12.5 MG tablet Take 1 tablet by mouth daily. 90 tablet 3  . Multiple Vitamins-Minerals (MULTIVITAMIN ADULT PO) Take 1 tablet by mouth daily.     Social History   Socioeconomic History  . Marital status: Married    Spouse name: Not on file  . Number of children: 3  . Years of education: Not on file  . Highest education level: Bachelor's degree (e.g., BA, AB, BS)  Occupational History  . Occupation: Self employed    Comment: Owns Chemical engineer  Tobacco Use  . Smoking status: Never Smoker  . Smokeless tobacco: Never Used  Vaping Use  . Vaping Use: Never used  Substance and Sexual Activity  . Alcohol use: Yes    Alcohol/week: 10.0 - 12.0 standard drinks    Types: 10 - 12 Glasses of wine per week  . Drug use: No  .  Sexual activity: Not on file  Other Topics Concern  . Not on file  Social History Narrative  . Not on file   Social Determinants of Health   Financial Resource Strain: Low Risk   . Difficulty of Paying Living Expenses: Not hard at all  Food Insecurity: No Food Insecurity  . Worried About Programme researcher, broadcasting/film/video in the Last Year: Never true  . Ran Out of Food in the Last Year: Never true  Transportation Needs: No Transportation Needs  . Lack of Transportation (Medical): No  . Lack of Transportation  (Non-Medical): No  Physical Activity: Sufficiently Active  . Days of Exercise per Week: 4 days  . Minutes of Exercise per Session: 60 min  Stress: No Stress Concern Present  . Feeling of Stress : Not at all  Social Connections: Moderately Integrated  . Frequency of Communication with Friends and Family: More than three times a week  . Frequency of Social Gatherings with Friends and Family: More than three times a week  . Attends Religious Services: Never  . Active Member of Clubs or Organizations: Yes  . Attends Banker Meetings: More than 4 times per year  . Marital Status: Married  Catering manager Violence: Not At Risk  . Fear of Current or Ex-Partner: No  . Emotionally Abused: No  . Physically Abused: No  . Sexually Abused: No   Family History  Problem Relation Age of Onset  . Pancreatic cancer Mother   . Neuropathy Father   . Throat cancer Father   . Lung cancer Father     OBJECTIVE: Vitals:   06/25/20 1112 06/25/20 1113  BP: 115/77   Pulse: (!) 105   Resp: 16   Temp: 98.3 F (36.8 C)   TempSrc: Oral   SpO2: 95%   Weight:  262 lb (118.8 kg)  Height:  6\' 2"  (1.88 m)    General appearance: alert; no distress Head: NCAT Lungs: clear to auscultation bilaterally Heart: regular rate and rhythm.  Radial pulse 2+ bilaterally Extremities: no edema Skin: warm and dry; L elbow and L inner upper arm erythematous, swollen and tender to touch Psychological: alert and cooperative; normal mood and affect  ASSESSMENT & PLAN:  1. Cellulitis of left upper extremity   2. Pain and swelling of left elbow     Meds ordered this encounter  Medications  . doxycycline (VIBRAMYCIN) 100 MG capsule    Sig: Take 1 capsule (100 mg total) by mouth 2 (two) times daily.    Dispense:  14 capsule    Refill:  0    Order Specific Question:   Supervising Provider    Answer:   Merrilee Jansky   Prescribed doxycycline Take as prescribed and to completion Avoid hot  showers/ baths Moisturize skin daily  Follow up with PCP if symptoms persists Return or go to the ER if you have any new or worsening symptoms such as fever, chills, nausea, vomiting, redness, swelling, discharge, if symptoms do not improve with medications  Reviewed expectations re: course of current medical issues. Questions answered. Outlined signs and symptoms indicating need for more acute intervention. Patient verbalized understanding. After Visit Summary given.   [id number], NP 06/25/20 1137

## 2020-06-25 NOTE — Telephone Encounter (Signed)
Pt. Reports left elbow "looks worse. More red and swollen. Has a red streak about 5 inches going up my arm." Has an appointment at 2:40 today, wants to be seen sooner. No availability per [place] in the practice. Pt. Will keep appointment.

## 2020-06-25 NOTE — ED Triage Notes (Signed)
Pt reports having redness, pain and swelling to L elbow. Area is warm to touch. Unsure there was a bug bite to the area. Reports having a low grade fever.

## 2020-06-25 NOTE — Discharge Instructions (Signed)
I have sent in doxycycline for you to take twice a day for 7 days  Follow up with this office or with primary care if symptoms are persisting.  Follow up in the ER for high fever, trouble swallowing, trouble breathing, other concerning symptoms.  

## 2020-10-30 ENCOUNTER — Other Ambulatory Visit: Payer: Self-pay | Admitting: Family Medicine

## 2020-10-30 DIAGNOSIS — I10 Essential (primary) hypertension: Secondary | ICD-10-CM

## 2020-10-30 NOTE — Telephone Encounter (Signed)
Requested medication (s) are due for refill today: yes  Requested medication (s) are on the active medication list: yes  Last refill:  11/07/19  Future visit scheduled: no  Notes to clinic:  called pt and LM on VM to call office to schedule appt for med refill   Requested Prescriptions  Pending Prescriptions Disp Refills   losartan-hydrochlorothiazide (HYZAAR) 100-12.5 MG tablet [Pharmacy Med Name: LOSARTAN/HCTZ 100/12.5MG  TABLETS] 90 tablet 3    Sig: Take 1 tablet by mouth daily.      Cardiovascular: ARB + Diuretic Combos Failed - 10/30/2020  3:36 AM      Failed - K in normal range and within 180 days    Potassium  Date Value Ref Range Status  02/13/2020 4.7 3.5 - 5.2 mmol/L Final  05/18/2014 4.0 3.5 - 5.1 mmol/L Final          Failed - Na in normal range and within 180 days    Sodium  Date Value Ref Range Status  02/13/2020 142 134 - 144 mmol/L Final  05/18/2014 143 136 - 145 mmol/L Final          Failed - Cr in normal range and within 180 days    Creatinine  Date Value Ref Range Status  05/18/2014 1.03 0.60 - 1.30 mg/dL Final   Creatinine, Ser  Date Value Ref Range Status  02/13/2020 1.14 0.76 - 1.27 mg/dL Final          Failed - Ca in normal range and within 180 days    Calcium  Date Value Ref Range Status  02/13/2020 9.6 8.6 - 10.2 mg/dL Final   Calcium, Total  Date Value Ref Range Status  05/18/2014 7.7 (L) 8.5 - 10.1 mg/dL Final          Failed - Valid encounter within last 6 months    Recent Outpatient Visits           8 months ago Essential hypertension   [facility] Malva Limes, MD   11 months ago Essential hypertension   [facility] Malva Limes, MD   1 year ago Essential hypertension   [facility] Malva Limes, MD   3 years ago Laceration of left middle finger without foreign body, nail damage status unspecified, initial encounter   [facility] Malva Limes, MD   3  years ago Annual physical exam   [facility] Malva Limes, MD       Future Appointments             In 6 months [name], MD [facility]             Passed - Patient is not pregnant      Passed - Last BP in normal range    BP Readings from Last 1 Encounters:  06/25/20 115/77

## 2021-01-04 ENCOUNTER — Ambulatory Visit: Payer: Medicare Other

## 2021-02-27 ENCOUNTER — Other Ambulatory Visit: Payer: Self-pay | Admitting: Family Medicine

## 2021-02-27 NOTE — Telephone Encounter (Signed)
Requested medication (s) are due for refill today: yes  Requested medication (s) are on the active medication list: yes  Last refill:  05/23/20  Future visit scheduled: no  Notes to clinic:  over due lab work   Requested Prescriptions  Pending Prescriptions Disp Refills   levothyroxine (SYNTHROID) 50 MCG tablet [Pharmacy Med Name: LEVOTHYROXINE 50 MCG TABLET] 90 tablet 2    Sig: TAKE 1 TABLET BY MOUTH EVERY DAY      Endocrinology:  Hypothyroid Agents Failed - 02/27/2021 12:26 PM      Failed - TSH needs to be rechecked within 3 months after an abnormal result. Refill until TSH is due.      Failed - TSH in normal range and within 360 days    TSH  Date Value Ref Range Status  02/13/2020 2.650 0.450 - 4.500 uIU/mL Final          Failed - Valid encounter within last 12 months    Recent Outpatient Visits           1 year ago Essential hypertension   [facility] Malva Limes, MD   1 year ago Essential hypertension   [facility] Malva Limes, MD   1 year ago Essential hypertension   [facility] Malva Limes, MD   3 years ago Laceration of left middle finger without foreign body, nail damage status unspecified, initial encounter   [facility] Malva Limes, MD   3 years ago Annual physical exam   [facility] Malva Limes, MD       Future Appointments             In 2 months [name], MD [facility]

## 2021-05-03 ENCOUNTER — Other Ambulatory Visit: Payer: Medicare Other

## 2021-05-03 ENCOUNTER — Other Ambulatory Visit: Payer: Self-pay

## 2021-05-03 DIAGNOSIS — R972 Elevated prostate specific antigen [PSA]: Secondary | ICD-10-CM

## 2021-05-03 NOTE — Progress Notes (Signed)
05/04/21 11:22 AM   [name] 1951/06/19 [id number]  Referring provider:  Malva Limes, MD 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   He has nephrolithiasis and obstructing stone in 2015 and underwent ureteroscopy. He also had multiple urinalysis that showed hypocitraturia and low urine output.   He also has a history of ED. He was previously on Cialis which worked well but caused acid reflux.  He has not been able to tolerate sildenafil. He is interested in tadalafil today.   His most recent PSA on 05/03/2021 was 4.4.   He is doing well today. He reports his wife recently had a fall.   He reports that 4-5 nights he does not wake up to urinate. He can be in the car for 4-5 hours and does not have to urinate. He states he has not had any stone encounters.  Overall, he is extremely pleased with his urinary symptoms.  He believes that some of these were previously behavioral and stress related.  PSA trend:  Component Prostate Specific Ag, Serum  Latest Ref Rng & Units 0.0 - 4.0 ng/mL  03/09/2017 4.3 (H)  04/19/2018 5.2 (H)  04/23/2019 5.0 (H)  05/04/2020 4.8 (H)  05/03/2021 4.4 (H)     PMH: Past Medical History:  Diagnosis Date   Acute gallstone pancreatitis    Arthritis    Chronic kidney disease    stones   Dysrhythmia    GERD (gastroesophageal reflux disease)    HOH (hard of hearing)    Hypertension    Hypothyroidism    Prostate disease    Sleep apnea     Surgical History: Past Surgical History:  Procedure Laterality Date   ARM SKIN LESION BIOPSY / EXCISION     CATARACT EXTRACTION W/PHACO Right 02/23/2015   Procedure: CATARACT EXTRACTION PHACO AND INTRAOCULAR  LENS PLACEMENT (IOC);  Surgeon: Galen Manila, MD;  Location: [facility] ORS;  Service: Ophthalmology;  Laterality: Right;  [place] 01:01 AP%23.5 CDE 14.49 fluid pack lot #[id number]   CATARACT EXTRACTION W/PHACO Left 03/30/2015   Procedure: CATARACT EXTRACTION PHACO AND INTRAOCULAR LENS PLACEMENT (IOC);  Surgeon: Galen Manila, MD;  Location: [facility] ORS;  Service: Ophthalmology;  Laterality: Left;  [place]: 01:08.3 AP%: 22.1 CDE: 15.12  Fluid Lot # [id number]   CHOLECYSTECTOMY N/A 11/23/2015   Procedure: LAPAROSCOPIC CHOLECYSTECTOMY;  Surgeon: Gladis Riffle, MD;  Location: [facility] ORS;  Service: General;  Laterality: N/A;   COLONOSCOPY     ENDOSCOPIC RETROGRADE CHOLANGIOPANCREATOGRAPHY (ERCP) WITH PROPOFOL N/A 11/22/2015   Procedure: ENDOSCOPIC RETROGRADE CHOLANGIOPANCREATOGRAPHY (ERCP) WITH PROPOFOL;  Surgeon: Midge Minium, MD;  Location: [facility] ENDOSCOPY;  Service: Endoscopy;  Laterality: N/A;   INCISION AND DRAINAGE     anal infection   KIDNEY STONE SURGERY     Right Ureterolithiasis with stent placement  04/29/2014   Dr. Sheppard Penton    Home Medications:  Allergies as of 05/04/2021       Reactions   Fentanyl Itching   Crestor  [rosuvastatin Calcium]    Other reaction(s): Muscle Pain (severe)   Ibuprofen    Penicillins         Medication List        Accurate as of May 04, 2021 11:22 AM. If you have any questions, ask your nurse or doctor.          STOP taking these medications    doxycycline 100 MG capsule Commonly known as: VIBRAMYCIN       TAKE these medications    levothyroxine 50 MCG tablet Commonly known as: SYNTHROID TAKE 1 TABLET BY MOUTH EVERY DAY   losartan-hydrochlorothiazide 100-12.5 MG tablet Commonly known as: HYZAAR TAKE 1 TABLET BY MOUTH DAILY   MULTIVITAMIN ADULT PO Take 1 tablet by mouth daily.   tadalafil 5 MG tablet Commonly known as: CIALIS Take 1 tablet by mouth daily. What changed: Another medication with the same name was added. Make sure you  understand how and when to take each.   tadalafil 20 MG tablet Commonly known as: CIALIS Take 1 tablet (20 mg total) by mouth daily as needed for erectile dysfunction. What changed: You were already taking a medication with the same name, and this prescription was added. Make sure you understand how and when to take each.        Allergies:  Allergies  Allergen Reactions   Fentanyl Itching   Crestor  [Rosuvastatin Calcium]     Other reaction(s): Muscle Pain (severe)   Ibuprofen    Penicillins     Family History: Family History  Problem Relation Age of Onset   Pancreatic cancer Mother    Neuropathy Father    Throat cancer Father    Lung cancer Father     Social History:  reports that he has never smoked. He has never used smokeless tobacco. He reports current alcohol use of about 10.0 - 12.0 standard drinks per week. He reports that he does not use drugs.   Physical Exam: BP (!) 142/93   Pulse 73   Ht 6\' 2"  (1.88 m)   Wt 241 lb (109.3 kg)   BMI 30.94 kg/m   Constitutional:  Alert and oriented, No acute distress. HEENT: [place] AT, moist mucus membranes.  Trachea midline, no masses. Cardiovascular: No clubbing, cyanosis, or edema. Respiratory: Normal respiratory effort, no increased work of breathing. Skin: No rashes, bruises or suspicious lesions. Neurologic: Grossly intact, no focal deficits, moving all 4 extremities. Psychiatric: Normal mood and affect.  Laboratory Data:  Lab Results  Component Value Date   CREATININE 1.14 02/13/2020    Lab Results  Component Value Date   HGBA1C 5.6 02/13/2020     Assessment & Plan:    BPH associated with nocturia  - Symptomatically stable  - Nocturia has improved  - Doing well overall   Elevated PSA  - PSA went down to 4.4  - Continue annual PSA screening  -Given downward trending PSA, will defer rectal exam this year but pursue at least every other year  Combined arterial insufficiency an corporo-venous occlusive  ED  - Interested in tadalafil  today  - Start tadalafil 20 mg as needed, discussed possible side effects, etc.: Provided good Rx coupon  History of kidney stones  - Asymptomatic   Follow-up in 1 year for IPSS/PVR/PSA/DRE  I,Kailey Littlejohn,acting as a scribe for 02/15/2020, MD.,have documented all relevant documentation on the behalf of [name], MD,as directed by  [name], MD while in the presence of [name], MD.  I have reviewed the above documentation for accuracy and completeness, and I agree with the above.   [name], MD   [facility] [address] [place], [place] [phone]

## 2021-05-04 ENCOUNTER — Ambulatory Visit (INDEPENDENT_AMBULATORY_CARE_PROVIDER_SITE_OTHER): Payer: Medicare Other | Admitting: Urology

## 2021-05-04 VITALS — BP 142/93 | HR 73 | Ht 74.0 in | Wt 241.0 lb

## 2021-05-04 DIAGNOSIS — R972 Elevated prostate specific antigen [PSA]: Secondary | ICD-10-CM | POA: Diagnosis not present

## 2021-05-04 DIAGNOSIS — N401 Enlarged prostate with lower urinary tract symptoms: Secondary | ICD-10-CM | POA: Diagnosis not present

## 2021-05-04 DIAGNOSIS — N138 Other obstructive and reflux uropathy: Secondary | ICD-10-CM

## 2021-05-04 DIAGNOSIS — Z87442 Personal history of urinary calculi: Secondary | ICD-10-CM

## 2021-05-04 LAB — PSA: Prostate Specific Ag, Serum: 4.4 ng/mL — ABNORMAL HIGH (ref 0.0–4.0)

## 2021-05-04 MED ORDER — TADALAFIL 20 MG PO TABS: 20.0000 mg | ORAL_TABLET | Freq: Every day | ORAL | 3 refills | Status: DC | PRN

## 2021-11-01 ENCOUNTER — Other Ambulatory Visit: Payer: Self-pay | Admitting: Family Medicine

## 2021-11-01 DIAGNOSIS — I10 Essential (primary) hypertension: Secondary | ICD-10-CM

## 2022-05-04 ENCOUNTER — Other Ambulatory Visit: Payer: Self-pay | Admitting: *Deleted

## 2022-05-04 ENCOUNTER — Other Ambulatory Visit: Payer: Medicare Other

## 2022-05-04 DIAGNOSIS — R972 Elevated prostate specific antigen [PSA]: Secondary | ICD-10-CM

## 2022-05-05 LAB — PSA: Prostate Specific Ag, Serum: 4.8 ng/mL — ABNORMAL HIGH (ref 0.0–4.0)

## 2022-05-10 ENCOUNTER — Encounter: Payer: Self-pay | Admitting: Urology

## 2022-05-10 ENCOUNTER — Ambulatory Visit (INDEPENDENT_AMBULATORY_CARE_PROVIDER_SITE_OTHER): Payer: Medicare Other | Admitting: Urology

## 2022-05-10 VITALS — BP 150/77 | HR 90 | Ht 74.0 in | Wt 256.0 lb

## 2022-05-10 DIAGNOSIS — N138 Other obstructive and reflux uropathy: Secondary | ICD-10-CM | POA: Diagnosis not present

## 2022-05-10 DIAGNOSIS — Z87442 Personal history of urinary calculi: Secondary | ICD-10-CM | POA: Diagnosis not present

## 2022-05-10 DIAGNOSIS — N401 Enlarged prostate with lower urinary tract symptoms: Secondary | ICD-10-CM

## 2022-05-10 DIAGNOSIS — R972 Elevated prostate specific antigen [PSA]: Secondary | ICD-10-CM

## 2022-05-10 DIAGNOSIS — N5203 Combined arterial insufficiency and corporo-venous occlusive erectile dysfunction: Secondary | ICD-10-CM | POA: Diagnosis not present

## 2022-05-11 NOTE — Progress Notes (Signed)
05/10/2022 2:49 PM   [name] [name] [birth year] [id number]  Referring provider: Malva Limes, MD [address] [place],  [place] [address]  Chief Complaint  Patient presents with   Elevated PSA    HPI: 71 year old male who returns today for routine annual follow-up for history of elevated PSA BPH and kidney stones.  He has a personal history of nephrolithiasis and obstructing stone back in 2015 status post ureteroscopy.  Over the past year, has not had any further issues with this.  Stopped doing serial imaging given his active symptoms.  No flank pain or gross hematuria.  He has a history history of ED and tried both Cialis and sildenafil.  He is not able to tolerate these secondary to GI distress.  Personal history of elevated/fluctuating PSA.  This in the 4-5 range.  Most recent PSA 4.8.  Presents personal history of BPH with frequency.  More recently, his frequency has subsided to last night, he got up no times and on previous nights only 1-2 times.  He is not on any medications currently.    PMH: Past Medical History:  Diagnosis Date   Acute gallstone pancreatitis    Arthritis    Chronic kidney disease    stones   Dysrhythmia    GERD (gastroesophageal reflux disease)    HOH (hard of hearing)    Hypertension    Hypothyroidism    Prostate disease    Sleep apnea     Surgical History: Past Surgical History:  Procedure Laterality Date   ARM SKIN LESION BIOPSY / EXCISION     CATARACT EXTRACTION W/PHACO Right 02/23/2015   Procedure: CATARACT EXTRACTION PHACO AND INTRAOCULAR LENS PLACEMENT (IOC);  Surgeon: Galen Manila, MD;  Location: [facility] OR[facility];  Service: Ophthalmology;  Laterality: Right;  [place] 01:01 AP%23.5 CDE 14.49 fluid pack lot #[id number]   CATARACT EXTRACTION W/PHACO Left 03/30/2015   Procedure: CATARACT EXTRACTION PHACO AND INTRAOCULAR LENS PLACEMENT (IOC);  Surgeon: Galen Manila, MD;  Location: [facility] OR[facility];  Service: Ophthalmology;  Laterality:  Left;  [place]: 01:08.3 AP%: 22.1 CDE: 15.12  Fluid Lot # [id number]   CHOLECYSTECTOMY N/A 11/23/2015   Procedure: LAPAROSCOPIC CHOLECYSTECTOMY;  Surgeon: Gladis Riffle, MD;  Location: [facility] ORS;  Service: General;  Laterality: N/A;   COLONOSCOPY     ENDOSCOPIC RETROGRADE CHOLANGIOPANCREATOGRAPHY (ERCP) WITH PROPOFOL N/A 11/22/2015   Procedure: ENDOSCOPIC RETROGRADE CHOLANGIOPANCREATOGRAPHY (ERCP) WITH PROPOFOL;  Surgeon: Midge Minium, MD;  Location: [facility] ENDOS[facility];  Service: Endoscopy;  Laterality: N/A;   INCISION AND DRAINAGE     anal infection   KIDNEY STONE SURGERY     Right Ureterolithiasis with stent placement  04/29/2014   Dr. Sheppard Penton    Home Medications:  Allergies as of 05/10/2022       Reactions   Fentanyl Itching   Crestor  [rosuvastatin Calcium]    Other reaction(s): Muscle Pain (severe)   Ibuprofen    Penicillins         Medication List        Accurate as of May 10, 2022 11:59 PM. If you have any questions, ask your nurse or doctor.          STOP taking these medications    tadalafil 20 MG tablet Commonly known as: CIALIS Stopped by: [name], MD   tadalafil 5 MG tablet Commonly known as: CIALIS Stopped by: [name], MD       TAKE these medications    levothyroxine 50 MCG tablet Commonly known as: SYNTHROID TAKE  1 TABLET BY MOUTH EVERY DAY   losartan-hydrochlorothiazide 100-12.5 MG tablet Commonly known as: HYZAAR TAKE 1 TABLET BY MOUTH DAILY   MULTIVITAMIN ADULT PO Take 1 tablet by mouth daily.        Allergies:  Allergies  Allergen Reactions   Fentanyl Itching   Crestor  [Rosuvastatin Calcium]     Other reaction(s): Muscle Pain (severe)   Ibuprofen    Penicillins     Family History: Family History  Problem Relation Age of Onset   Pancreatic cancer Mother    Neuropathy Father    Throat cancer Father    Lung cancer Father     Social History:  reports that he has never smoked. He has never used smokeless  tobacco. He reports current alcohol use of about 10.0 - 12.0 standard drinks of alcohol per week. He reports that he does not use drugs.   Physical Exam: BP (!) 150/77   Pulse 90   Ht 6\' 2"  (1.88 m)   Wt 256 lb (116.1 kg)   BMI 32.87 kg/m   Constitutional:  Alert and oriented, No acute distress. HEENT: [place] AT, moist mucus membranes.  Trachea midline, no masses. Rectal: Normal sphincter tone.  50+ cc prostate, nontender no nodules Neurologic: Grossly intact, no focal deficits, moving all 4 extremities. Psychiatric: Normal mood and affect.  Laboratory Data: Lab Results  Component Value Date   WBC 5.2 11/22/2015   HGB 12.6 (L) 11/22/2015   HCT 37.5 (L) 11/22/2015   MCV 91.5 11/22/2015   PLT 177 11/22/2015    Lab Results  Component Value Date   CREATININE 1.14 02/13/2020     Lab Results  Component Value Date   HGBA1C 5.6 02/13/2020      Assessment & Plan:    1. Benign prostatic hyperplasia with urinary obstruction Symptoms improved, not on meds - PSA; Future  2. Elevated PSA Presents to have elevated relatively stable PSA  Rectal exam today is reassuring  We will continue to follow this annually - PSA; Future  3. History of nephrolithiasis Asymptomatic  4. Combined arterial insufficiency and corporo-venous occlusive erectile dysfunction Failed PDE 5 inhibitors, discussed alternatives including injections, vacuum erection device or implant.  Not interested in further treatment   Return in about 1 year (around 05/11/2023) for  PSA lab prior, DRE, IPSS.  05/13/2023, MD  [facility] [address], [address] [place], [place] [phone]

## 2023-05-07 ENCOUNTER — Other Ambulatory Visit: Payer: Medicare Other

## 2023-05-08 ENCOUNTER — Encounter: Payer: Self-pay | Admitting: Urology

## 2023-05-09 ENCOUNTER — Ambulatory Visit (INDEPENDENT_AMBULATORY_CARE_PROVIDER_SITE_OTHER): Payer: Medicare Other | Admitting: Urology

## 2023-05-09 ENCOUNTER — Encounter: Payer: Self-pay | Admitting: Urology

## 2023-05-09 VITALS — BP 130/79 | HR 85 | Ht 74.0 in | Wt 265.0 lb

## 2023-05-09 DIAGNOSIS — N401 Enlarged prostate with lower urinary tract symptoms: Secondary | ICD-10-CM

## 2023-05-09 DIAGNOSIS — N529 Male erectile dysfunction, unspecified: Secondary | ICD-10-CM | POA: Diagnosis not present

## 2023-05-09 DIAGNOSIS — R972 Elevated prostate specific antigen [PSA]: Secondary | ICD-10-CM

## 2023-05-09 DIAGNOSIS — N138 Other obstructive and reflux uropathy: Secondary | ICD-10-CM

## 2023-05-09 NOTE — Progress Notes (Signed)
[name] [name],acting as a scribe for [name], MD.,have documented all relevant documentation on the behalf of [name], MD,as directed by  [name], MD while in the presence of [name], MD.  05/09/2023 11:07 AM   [name] [name] Jan 26, 1951 [id number]  Referring provider: Malva Limes, MD [address] [place],  [place] [address]  Chief Complaint  Patient presents with   Benign Prostatic Hypertrophy   Elevated PSA    HPI:  72 year-old male with a personal history of erectile dysfunction, nephrolithiasis, BPH, and elevated PSA who returns today for routine annual follow up.   He has not had a stone event since 2015. He recently underwent a ureteroscopy. He has been doing well in that regard.  For erectile dysfunction, he is unable to tolerate PDE-5 inhibitors. Last year, he was no interested in any further therapies.  He does have a personal history of chronically elevated PSA in the 4-5 range. His PSA today was collected and is pending. Last year's PSA was 4.8.  In terms of urinary symptoms, he is not on any BPH medications. He reports increased frequency, but is overall satisfied.     IPSS     Row Name 05/09/23 1032         International Prostate Symptom Score   How often have you had the sensation of not emptying your bladder? Not at All     How often have you had to urinate less than every two hours? About half the time     How often have you found you stopped and started again several times when you urinated? Less than 1 in 5 times     How often have you found it difficult to postpone urination? Less than 1 in 5 times     How often have you had a weak urinary stream? Less than half the time     How often have you had to strain to start urination? Less than 1 in 5 times     How many times did you typically get up at night to urinate? 1 Time     Total IPSS Score 9       Quality of Life due to urinary symptoms   If you were to  spend the rest of your life with your urinary condition just the way it is now how would you feel about that? Mostly Satisfied              Score:  1-7 Mild 8-19 Moderate 20-35 Severe    PMH: Past Medical History:  Diagnosis Date   Acute gallstone pancreatitis    Arthritis    Chronic kidney disease    stones   Dysrhythmia    GERD (gastroesophageal reflux disease)    HOH (hard of hearing)    Hypertension    Hypothyroidism    Prostate disease    Sleep apnea     Surgical History: Past Surgical History:  Procedure Laterality Date   ARM SKIN LESION BIOPSY / EXCISION     CATARACT EXTRACTION W/PHACO Right 02/23/2015   Procedure: CATARACT EXTRACTION PHACO AND INTRAOCULAR LENS PLACEMENT (IOC);  Surgeon: Galen Manila, MD;  Location: [facility] ORS;  Service: Ophthalmology;  Laterality: Right;  [place] 01:01 AP%23.5 CDE 14.49 fluid pack lot #[id number]   CATARACT EXTRACTION W/PHACO Left 03/30/2015   Procedure: CATARACT EXTRACTION PHACO AND INTRAOCULAR LENS PLACEMENT (IOC);  Surgeon: Galen Manila, MD;  Location: [facility] ORS;  Service: Ophthalmology;  Laterality: Left;  [place]: 01:08.3 AP%: 22.1 CDE: 15.12  Fluid Lot # [id number]   CHOLECYSTECTOMY N/A 11/23/2015   Procedure: LAPAROSCOPIC CHOLECYSTECTOMY;  Surgeon: Gladis Riffle, MD;  Location: [facility] ORS;  Service: General;  Laterality: N/A;   COLONOSCOPY     ENDOSCOPIC RETROGRADE CHOLANGIOPANCREATOGRAPHY (ERCP) WITH PROPOFOL N/A 11/22/2015   Procedure: ENDOSCOPIC RETROGRADE CHOLANGIOPANCREATOGRAPHY (ERCP) WITH PROPOFOL;  Surgeon: Midge Minium, MD;  Location: [facility] ENDOSCOPY;  Service: Endoscopy;  Laterality: N/A;   INCISION AND DRAINAGE     anal infection   KIDNEY STONE SURGERY     Right Ureterolithiasis with stent placement  04/29/2014   Dr. Sheppard Penton    Home Medications:  Allergies as of 05/09/2023       Reactions   Fentanyl Itching   Crestor  [rosuvastatin Calcium]    Other reaction(s): Muscle Pain (severe)   Ibuprofen     Penicillins         Medication List        Accurate as of May 09, 2023 11:07 AM. If you have any questions, ask your nurse or doctor.          levothyroxine 50 MCG tablet Commonly known as: SYNTHROID TAKE 1 TABLET BY MOUTH EVERY DAY   losartan-hydrochlorothiazide 100-12.5 MG tablet Commonly known as: HYZAAR TAKE 1 TABLET BY MOUTH DAILY   MULTIVITAMIN ADULT PO Take 1 tablet by mouth daily.        Allergies:  Allergies  Allergen Reactions   Fentanyl Itching   Crestor  [Rosuvastatin Calcium]     Other reaction(s): Muscle Pain (severe)   Ibuprofen    Penicillins     Family History: Family History  Problem Relation Age of Onset   Pancreatic cancer Mother    Neuropathy Father    Throat cancer Father    Lung cancer Father     Social History:  reports that he has never smoked. He has never used smokeless tobacco. He reports current alcohol use of about 10.0 - 12.0 standard drinks of alcohol per week. He reports that he does not use drugs.   Physical Exam: BP 130/79   Pulse 85   Ht 6\' 2"  (1.88 m)   Wt 265 lb (120.2 kg)   BMI 34.02 kg/m   Constitutional:  Alert and oriented, No acute distress. HEENT: [place] AT, moist mucus membranes.  Trachea midline, no masses. GU: 50+ cc prostate, Non-tender, no nodules Neurologic: Grossly intact, no focal deficits, moving all 4 extremities. Psychiatric: Normal mood and affect.  Assessment & Plan:    1. BPH/ history of elevated SPA - DRE today -PSA pending - Given his relatively good health, we will continue to follow this  2. Erectile dysfunction - Continue current management as he is not interested in further therapies.  Return in about 1 year (around 05/08/2024) for repeat PSA, PVR, IPSS, DRE.  I have reviewed the above documentation for accuracy and completeness, and I agree with the above.   [name], MD    [facility] [address] [place], [place]  [address] [phone]

## 2023-05-10 LAB — PSA: Prostate Specific Ag, Serum: 4.7 ng/mL — ABNORMAL HIGH (ref 0.0–4.0)

## 2023-08-03 ENCOUNTER — Encounter: Payer: Self-pay | Admitting: Internal Medicine

## 2023-09-04 ENCOUNTER — Ambulatory Visit: Payer: Medicare Other | Admitting: Anesthesiology

## 2023-09-04 ENCOUNTER — Ambulatory Visit
Admission: RE | Admit: 2023-09-04 | Discharge: 2023-09-04 | Disposition: A | Discharge status: Home / Self Care | Payer: Medicare Other | Source: Ambulatory Visit | Attending: Internal Medicine | Admitting: Internal Medicine

## 2023-09-04 ENCOUNTER — Encounter: Payer: Self-pay | Admitting: Internal Medicine

## 2023-09-04 ENCOUNTER — Encounter
Admission: RE | Disposition: A | Discharge status: Home / Self Care | Payer: Self-pay | Source: Ambulatory Visit | Attending: Internal Medicine

## 2023-09-04 DIAGNOSIS — Z1211 Encounter for screening for malignant neoplasm of colon: Secondary | ICD-10-CM | POA: Insufficient documentation

## 2023-09-04 DIAGNOSIS — K449 Diaphragmatic hernia without obstruction or gangrene: Secondary | ICD-10-CM | POA: Insufficient documentation

## 2023-09-04 DIAGNOSIS — N183 Chronic kidney disease, stage 3 unspecified: Secondary | ICD-10-CM | POA: Diagnosis not present

## 2023-09-04 DIAGNOSIS — I129 Hypertensive chronic kidney disease with stage 1 through stage 4 chronic kidney disease, or unspecified chronic kidney disease: Secondary | ICD-10-CM | POA: Diagnosis not present

## 2023-09-04 DIAGNOSIS — G4730 Sleep apnea, unspecified: Secondary | ICD-10-CM | POA: Insufficient documentation

## 2023-09-04 DIAGNOSIS — K2100 Gastro-esophageal reflux disease with esophagitis, without bleeding: Secondary | ICD-10-CM | POA: Insufficient documentation

## 2023-09-04 DIAGNOSIS — K2970 Gastritis, unspecified, without bleeding: Secondary | ICD-10-CM | POA: Diagnosis not present

## 2023-09-04 DIAGNOSIS — Z800 Family history of malignant neoplasm of digestive organs: Secondary | ICD-10-CM | POA: Insufficient documentation

## 2023-09-04 DIAGNOSIS — K5730 Diverticulosis of large intestine without perforation or abscess without bleeding: Secondary | ICD-10-CM | POA: Diagnosis not present

## 2023-09-04 DIAGNOSIS — K641 Second degree hemorrhoids: Secondary | ICD-10-CM | POA: Insufficient documentation

## 2023-09-04 HISTORY — PX: COLONOSCOPY WITH PROPOFOL: SHX5780

## 2023-09-04 HISTORY — PX: ESOPHAGOGASTRODUODENOSCOPY (EGD) WITH PROPOFOL: SHX5813

## 2023-09-04 SURGERY — COLONOSCOPY WITH PROPOFOL
Anesthesia: General

## 2023-09-04 MED ORDER — PROPOFOL 500 MG/50ML IV EMUL
INTRAVENOUS | Status: DC | PRN
  Administered 2023-09-04: 140 ug/kg/min via INTRAVENOUS

## 2023-09-04 MED ORDER — PHENYLEPHRINE 80 MCG/ML (10ML) SYRINGE FOR IV PUSH (FOR BLOOD PRESSURE SUPPORT)
PREFILLED_SYRINGE | INTRAVENOUS | Status: DC | PRN
  Administered 2023-09-04: 80 ug via INTRAVENOUS

## 2023-09-04 MED ORDER — SODIUM CHLORIDE 0.9 % IV SOLN: INTRAVENOUS | Status: DC

## 2023-09-04 MED ORDER — PROPOFOL 10 MG/ML IV BOLUS
INTRAVENOUS | Status: DC | PRN
  Administered 2023-09-04: 50 mg via INTRAVENOUS

## 2023-09-04 NOTE — Transfer of Care (Signed)
 Immediate Anesthesia Transfer of Care Note  Patient: [name] [name]  Procedure(s) Performed: COLONOSCOPY WITH PROPOFOL  ESOPHAGOGASTRODUODENOSCOPY (EGD) WITH PROPOFOL   Patient Location: PACU  Anesthesia Type:General  Level of Consciousness: drowsy  Airway & Oxygen Therapy: Patient Spontanous Breathing and Patient connected to face mask oxygen  Post-op Assessment: Report given to RN, Post -op Vital signs reviewed and stable, and Patient moving all extremities X 4  Post vital signs: Reviewed and stable  Last Vitals:  Vitals Value Taken Time  BP 95/66 09/04/23 1023  Temp    Pulse 87 09/04/23 1025  Resp 20 09/04/23 1025  SpO2 91 % 09/04/23 1025  Vitals shown include unfiled device data.  Last Pain:  Vitals:   09/04/23 0851  TempSrc: Temporal         Complications: No notable events documented.

## 2023-09-04 NOTE — H&P (Signed)
 Outpatient short stay form Pre-procedure 09/04/2023 9:43 AM Ayeden Gladman K. Aundria, M.D.  Primary Physician: Layman Piety, M.D.  Reason for visit:  Positive FIT, GERD  History of present illness:  Patient's last colonoscopy was normal 2009. He did sustain a colon perforation that was noted a couple weeks after the procedure and this was treated with drainage tube and antibiotics. He had no surgical resection of the colon.   He has noted no change in bowel habits, blood or melena stool. He has been doing annual FIT testing and last year was negative and this year was positive. He does recall prior to the FIT test he was taking 200 mg of Advil at bedtime, so he thinks that may have been the cause for the positive test. No weight loss, anorexia, abdominal pain, change in bowel habits, or hematochezia.   He does have a history of mild heartburn once or twice a week for years and treats this with Tums that relieves it. He does have a change in his voice which is a lot raspier than normal. He does recall taking Prilosec in the distant past for throat raspiness and it seemed to help. He is requesting an EGD at the time of the colonoscopy. Occasional Advil, No Anti-plt agents, and anticoagulants  There is family history of father with esophageal cancer, but after discussion today, it is possible that he had throat cancer secondary to radiation exposure at work. Lung cancer in his father; Pancreatic cancer in his mother. 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      Current Facility-Administered Medications:    0.9 %  sodium chloride  infusion, , Intravenous, Continuous, [place], Chandi Nicklin K, MD, Last Rate: 20 mL/hr at 09/04/23 0900, New Bag at 09/04/23 0900  Medications Prior to Admission  Medication Sig Dispense Refill Last Dose/Taking   levothyroxine  (SYNTHROID ) 50 MCG tablet TAKE 1 TABLET BY MOUTH EVERY DAY 90 tablet 2 09/04/2023 Morning    losartan -hydrochlorothiazide (HYZAAR) 100-12.5 MG tablet TAKE 1 TABLET BY MOUTH DAILY 90 tablet 3 09/03/2023   Multiple Vitamins-Minerals (MULTIVITAMIN ADULT PO) Take 1 tablet by mouth daily.   Past Week   omeprazole (PRILOSEC OTC) 20 MG tablet Take 20 mg by mouth daily. (Patient not taking: Reported on 09/04/2023)   Not Taking     Allergies  Allergen Reactions   Fentanyl  Itching   Crestor  [Rosuvastatin Calcium]     Other reaction(s): Muscle Pain (severe)   Ibuprofen    Penicillins    Prilosec [Omeprazole] Diarrhea     Past Medical History:  Diagnosis Date   Acute gallstone pancreatitis    Arthritis    Chronic kidney disease    stones   Dysrhythmia    extra beats holter monitor was negative   GERD (gastroesophageal reflux disease)    HOH (hard of hearing)    Hypertension    Hypothyroidism    Prostate disease    Sleep apnea     Review of systems:  Otherwise negative.    Physical Exam  Gen: Alert, oriented. Appears stated age.  HEENT: [place]/AT. PERRLA. Lungs: CTA, no wheezes. CV: RR nl S1, S2. Abd: soft, benign, no masses. BS+ Ext: No edema. Pulses 2+    Planned procedures: Proceed with EGD and colonoscopy. The patient understands the nature of the planned procedure, indications, risks, alternatives and potential complications including but not limited to bleeding, infection, perforation, damage to internal organs and possible oversedation/side effects from anesthesia. The patient agrees and  gives consent to proceed.  Please refer to procedure notes for findings, recommendations and patient disposition/instructions.     Lane Eland K. Aundria, M.D. Gastroenterology 09/04/2023  9:43 AM

## 2023-09-04 NOTE — Anesthesia Postprocedure Evaluation (Signed)
 Anesthesia Post Note  Patient: Clennon Nasca  Procedure(s) Performed: COLONOSCOPY WITH PROPOFOL  ESOPHAGOGASTRODUODENOSCOPY (EGD) WITH PROPOFOL   Patient location during evaluation: PACU Anesthesia Type: General Level of consciousness: awake and alert, oriented and patient cooperative Pain management: pain level controlled Vital Signs Assessment: post-procedure vital signs reviewed and stable Respiratory status: spontaneous breathing, nonlabored ventilation and respiratory function stable Cardiovascular status: blood pressure returned to baseline and stable Postop Assessment: adequate PO intake Anesthetic complications: no   No notable events documented.   Last Vitals:  Vitals:   09/04/23 1023 09/04/23 1033  BP: 95/66 110/78  Pulse: 85   Resp:    Temp: (!) 36.4 C   SpO2:      Last Pain:  Vitals:   09/04/23 1033  TempSrc:   PainSc: 0-No pain                 Alfonso Ruths

## 2023-09-04 NOTE — Op Note (Signed)
 [facility] Gastroenterology Patient Name: [name] [name] Procedure Date: 09/04/2023 9:40 AM MRN: [id number] Account #: [id number] Date of Birth: September 26, 1950 Admit Type: Outpatient Age: 73 Room: [facility] ENDO ROOM 3 Gender: Male Note Status: Finalized Instrument Name: [id number] Procedure:             Upper GI endoscopy Indications:           Heartburn, Suspected gastro-esophageal reflux disease Providers:             Jarquavious Fentress K. Geraldin Habermehl MD, MD Medicines:             Propofol  per Anesthesia Complications:         No immediate complications. Estimated blood loss: None. Procedure:             Pre-Anesthesia Assessment:                        - The risks and benefits of the procedure and the                         sedation options and risks were discussed with the                         patient. All questions were answered and informed                         consent was obtained.                        - Patient identification and proposed procedure were                         verified prior to the procedure by the nurse. The                         procedure was verified in the procedure room.                        - ASA Grade Assessment: III - A patient with severe                         systemic disease.                        - After reviewing the risks and benefits, the patient                         was deemed in satisfactory condition to undergo the                         procedure.                        After obtaining informed consent, the endoscope was                         passed under direct vision. Throughout the procedure,                         the patient's blood pressure, pulse, and oxygen  saturations were monitored continuously. The Endoscope                         was introduced through the mouth, and advanced to the                         third part of duodenum. The upper GI endoscopy was                          accomplished without difficulty. The patient tolerated                         the procedure well. Findings:      LA Grade A (one or more mucosal breaks less than 5 mm, not extending       between tops of 2 mucosal folds) esophagitis with no bleeding was found       in the distal esophagus.      The exam of the esophagus was otherwise normal.      Patchy mild inflammation characterized by erosions and erythema was       found in the gastric body and in the gastric antrum.      A 1 cm hiatal hernia was present.      The exam of the stomach was otherwise normal.      The examined duodenum was normal. Impression:            - LA Grade A reflux esophagitis with no bleeding.                        - Gastritis.                        - 1 cm hiatal hernia.                        - Normal examined duodenum.                        - No specimens collected. Recommendation:        - Use Pepcid (famotidine) 20 mg PO BID.                        - Proceed with colonoscopy                        - Consider Voquezna for reflux esophagitis if                         intolerant of PPI therapy. Procedure Code(s):     --- Professional ---                        [phone], Esophagogastroduodenoscopy, flexible,                         transoral; diagnostic, including collection of                         specimen(s) by brushing or washing, when performed                         (  separate procedure) Diagnosis Code(s):     --- Professional ---                        R12, Heartburn                        K44.9, Diaphragmatic hernia without obstruction or                         gangrene                        K29.70, Gastritis, unspecified, without bleeding                        K21.00, Gastro-esophageal reflux disease with                         esophagitis, without bleeding CPT copyright 2022 American Medical Association. All rights reserved. The codes documented in this report are preliminary and  upon coder review may  be revised to meet current compliance requirements. Ladell MARLA Boss MD, MD 09/04/2023 10:06:12 AM This report has been signed electronically. Number of Addenda: 0 Note Initiated On: 09/04/2023 9:40 AM Estimated Blood Loss:  Estimated blood loss: none.      [facility]

## 2023-09-04 NOTE — Anesthesia Preprocedure Evaluation (Addendum)
 Anesthesia Evaluation  Patient identified by MRN, date of birth, ID band Patient awake    Reviewed: Allergy & Precautions, NPO status , Patient's Chart, lab work & pertinent test results  History of Anesthesia Complications Negative for: history of anesthetic complications  Airway Mallampati: I   Neck ROM: Full    Dental   Bridges :   Pulmonary sleep apnea and Continuous Positive Airway Pressure Ventilation    Pulmonary exam normal breath sounds clear to auscultation       Cardiovascular hypertension, Normal cardiovascular exam Rhythm:Regular Rate:Normal  Echo 02/14/23:  Normal Stress Echocardiogram  NORMAL RIGHT VENTRICULAR SYSTOLIC FUNCTION  MILD VALVULAR REGURGITATION  NO VALVULAR STENOSIS NOTED     Neuro/Psych HOH    GI/Hepatic ,GERD  ,,  Endo/Other  Hypothyroidism  Obesity  Renal/GU Renal disease (nephrolithiasis, stage III CKD)   BPH    Musculoskeletal   Abdominal   Peds  Hematology negative hematology ROS (+)   Anesthesia Other Findings   Reproductive/Obstetrics                             Anesthesia Physical Anesthesia Plan  ASA: 2  Anesthesia Plan: General   Post-op Pain Management:    Induction: Intravenous  PONV Risk Score and Plan: 2 and Propofol  infusion, TIVA and Treatment may vary due to age or medical condition  Airway Management Planned: Natural Airway  Additional Equipment:   Intra-op Plan:   Post-operative Plan:   Informed Consent: I have reviewed the patients History and Physical, chart, labs and discussed the procedure including the risks, benefits and alternatives for the proposed anesthesia with the patient or authorized representative who has indicated his/her understanding and acceptance.       Plan Discussed with: CRNA  Anesthesia Plan Comments: (LMA/GETA backup discussed.  Patient consented for risks of anesthesia including but not  limited to:  - adverse reactions to medications - damage to eyes, teeth, lips or other oral mucosa - nerve damage due to positioning  - sore throat or hoarseness - damage to heart, brain, nerves, lungs, other parts of body or loss of life  Informed patient about role of CRNA in peri- and intra-operative care.  Patient voiced understanding.)        Anesthesia Quick Evaluation

## 2023-09-04 NOTE — Anesthesia Procedure Notes (Signed)
 Procedure Name: MAC Date/Time: 09/04/2023 9:54 AM  Performed by: Nelle Don, CRNAPre-anesthesia Checklist: Patient identified, Emergency Drugs available, Suction available and Patient being monitored Oxygen Delivery Method: Simple face mask

## 2023-09-04 NOTE — Interval H&P Note (Signed)
 History and Physical Interval Note:  09/04/2023 9:45 AM  [name] [name]  has presented today for surgery, with the diagnosis of 792.1 (ICD-9-CM) - R19.5 (ICD-10-CM) - Positive FIT (fecal immunochemical test).  The various methods of treatment have been discussed with the patient and family. After consideration of risks, benefits and other options for treatment, the patient has consented to  Procedure(s): COLONOSCOPY WITH PROPOFOL  (N/A) ESOPHAGOGASTRODUODENOSCOPY (EGD) WITH PROPOFOL  (N/A) as a surgical intervention.  The patient's history has been reviewed, patient examined, no change in status, stable for surgery.  I have reviewed the patient's chart and labs.  Questions were answered to the patient's satisfaction.     [place], Kaydence Baba

## 2023-09-04 NOTE — Op Note (Addendum)
 [facility] Gastroenterology Patient Name: [name] [name] Procedure Date: 09/04/2023 9:39 AM MRN: [id number] Account #: [id number] Date of Birth: [birth year] Admit Type: Outpatient Age: 73 Room: [facility] ENDO ROOM 3 Gender: Male Note Status: Supervisor Override Instrument Name: [id number] Procedure:             Colonoscopy Indications:           Screening for colorectal malignant neoplasm, Positive                         fecal immunochemical test Providers:             Ilea Hilton K. Jania Steinke MD, MD Medicines:             Propofol  per Anesthesia Complications:         No immediate complications. Procedure:             Pre-Anesthesia Assessment:                        - The risks and benefits of the procedure and the                         sedation options and risks were discussed with the                         patient. All questions were answered and informed                         consent was obtained.                        - Patient identification and proposed procedure were                         verified prior to the procedure by the nurse. The                         procedure was verified in the procedure room.                        - ASA Grade Assessment: III - A patient with severe                         systemic disease.                        - After reviewing the risks and benefits, the patient                         was deemed in satisfactory condition to undergo the                         procedure.                        After obtaining informed consent, the colonoscope was                         passed under direct vision. Throughout the procedure,  the patient's blood pressure, pulse, and oxygen                         saturations were monitored continuously. The                         Colonoscope was introduced through the anus and                         advanced to the the cecum, identified by appendiceal                          orifice and ileocecal valve. The colonoscopy was                         performed without difficulty. The patient tolerated                         the procedure well. The quality of the bowel                         preparation was good. The ileocecal valve, appendiceal                         orifice, and rectum were photographed. The ileocecal                         valve, appendiceal orifice, and rectum were                         photographed. Findings:      The perianal and digital rectal examinations were normal. Pertinent       negatives include normal sphincter tone, no palpable rectal lesions and       normal prostate (size, shape, and consistency).      Non-bleeding internal hemorrhoids were found during retroflexion. The       hemorrhoids were Grade II (internal hemorrhoids that prolapse but reduce       spontaneously).      Many medium-mouthed and small-mouthed diverticula were found in the       sigmoid colon.      The exam was otherwise without abnormality. Impression:            - Non-bleeding internal hemorrhoids.                        - Diverticulosis in the sigmoid colon.                        - The examination was otherwise normal.                        - No specimens collected. Recommendation:        - Patient has a contact number available for                         emergencies. The signs and symptoms of potential                         delayed complications were discussed with the  patient.                         Return to normal activities tomorrow. Written                         discharge instructions were provided to the patient.                        - Resume previous diet.                        - Continue present medications.                        - Repeat colonoscopy in 10 years for screening                         purposes.                        - Follow up with Luke Barefoot, NP at [facility]                          Gastroenterology.                        - Telephone GI office to schedule appointment in 2                         months.                        - The findings and recommendations were discussed with                         the patient. Procedure Code(s):     --- Professional ---                        H9878, Colorectal cancer screening; colonoscopy on                         individual not meeting criteria for high risk Diagnosis Code(s):     --- Professional ---                        K57.30, Diverticulosis of large intestine without                         perforation or abscess without bleeding                        K64.1, Second degree hemorrhoids                        Z12.11, Encounter for screening for malignant neoplasm                         of colon CPT copyright 2022 American Medical Association. All rights reserved. The codes documented in this report are preliminary and upon coder review may  be revised to meet current compliance requirements. Ladell MARLA Boss MD, MD 09/04/2023  10:26:32 AM This report has been signed electronically. Number of Addenda: 0 Note Initiated On: 09/04/2023 9:39 AM Scope Withdrawal Time: 0 hours 5 minutes 58 seconds  Total Procedure Duration: 0 hours 11 minutes 57 seconds  Estimated Blood Loss:  Estimated blood loss: none.      [facility]

## 2023-09-05 ENCOUNTER — Encounter: Payer: Self-pay | Admitting: Internal Medicine

## 2024-04-10 ENCOUNTER — Encounter: Payer: Self-pay | Admitting: Urology

## 2024-05-01 ENCOUNTER — Other Ambulatory Visit: Payer: Self-pay

## 2024-05-01 DIAGNOSIS — N401 Enlarged prostate with lower urinary tract symptoms: Secondary | ICD-10-CM

## 2024-05-01 NOTE — Progress Notes (Signed)
   05/08/2024 11:50 AM   [name] [name] 1950-10-07 [id number]  Reason for visit: Follow up ED, BPH, elevated PSA   HPI: Initial visit with me today, previously followed by Dr. Penne PSA 5.8 today (05/05/24)   History of OSA on CPAP  Prior HPI: Previously followed by Dr. Penne, last seen in September 2024 History of ED, nephrolithiasis, BPH, elevated PSA S/p 2 negative prior biopsies -prostate volume 93+g (2020) History of nephrolithiasis-no stone events since 2015  Physical Exam: BP (!) 146/87 (BP Location: Left Arm, Patient Position: Sitting, Cuff Size: Large)   Pulse 98   Ht 6' 2 (1.88 m)   Wt 255 lb (115.7 kg)   BMI 32.74 kg/m    Constitutional:  Alert and oriented, No acute distress.   Laboratory Data:  Latest Reference Range & Units 05/04/20 14:28 05/03/21 10:44 05/04/22 11:30 05/09/23 10:23  Prostate Specific Ag, Serum 0.0 - 4.0 ng/mL 4.8 (H) 4.4 (H) 4.8 (H) 4.7 (H)  (H): Data is abnormally high  Pertinent Imaging: N/A Assessment & Plan:    Elevated prostate specific antigen (PSA) Assessment & Plan: Baseline PSA 4.7-6 Multiple negative prior biopsies 90g+ prostate by US - PSAD ~0.06 PSA 5.8 (05/05/24)   He has had very stable PSAs for a number of years.  Recommend interval PSA in 6 months. Would maintain high threshold to pursue any further diagnostics-unless PSA trends high off baseline.  Considering longevity and excellent health, okay to continue routine screening.  I would prefer to discontinue in the next 1-2 years  Orders: -     PSA; Future  Benign prostatic hyperplasia with urinary obstruction Assessment & Plan: Historically minimal bother, IPSS 9 (2024) IPSS 5/1 today No medications  He is very content with current urinary habits.  No changes needed        Penne JONELLE Skye, MD  [facility] [address] [place], [place] [address] [phone]

## 2024-05-01 NOTE — Assessment & Plan Note (Addendum)
 Historically minimal bother, IPSS 9 (2024) IPSS 5/1 today No medications  He is very content with current urinary habits.  No changes needed

## 2024-05-01 NOTE — Assessment & Plan Note (Addendum)
 Baseline PSA 4.7-6 Multiple negative prior biopsies 90g+ prostate by US - PSAD ~0.06 PSA 5.8 (05/05/24)   He has had very stable PSAs for a number of years.  Recommend interval PSA in 6 months. Would maintain high threshold to pursue any further diagnostics-unless PSA trends high off baseline.  Considering longevity and excellent health, okay to continue routine screening.  I would prefer to discontinue in the next 1-2 years

## 2024-05-05 ENCOUNTER — Other Ambulatory Visit: Payer: Self-pay

## 2024-05-05 DIAGNOSIS — N138 Other obstructive and reflux uropathy: Secondary | ICD-10-CM

## 2024-05-06 LAB — PSA: Prostate Specific Ag, Serum: 5.8 ng/mL — ABNORMAL HIGH (ref 0.0–4.0)

## 2024-05-07 ENCOUNTER — Ambulatory Visit: Payer: Self-pay | Admitting: Urology

## 2024-05-08 ENCOUNTER — Ambulatory Visit (INDEPENDENT_AMBULATORY_CARE_PROVIDER_SITE_OTHER): Admitting: Urology

## 2024-05-08 ENCOUNTER — Encounter: Payer: Self-pay | Admitting: Urology

## 2024-05-08 VITALS — BP 146/87 | HR 98 | Ht 74.0 in | Wt 255.0 lb

## 2024-05-08 DIAGNOSIS — R972 Elevated prostate specific antigen [PSA]: Secondary | ICD-10-CM | POA: Diagnosis not present

## 2024-05-08 DIAGNOSIS — N401 Enlarged prostate with lower urinary tract symptoms: Secondary | ICD-10-CM

## 2024-05-08 DIAGNOSIS — N138 Other obstructive and reflux uropathy: Secondary | ICD-10-CM

## 2024-10-22 ENCOUNTER — Other Ambulatory Visit

## 2024-10-30 ENCOUNTER — Ambulatory Visit: Admitting: Urology
